# Patient Record
Sex: Male | Born: 1945
Health system: Southern US, Community
[De-identification: ages and names within clinical notes are randomized; demographics above are authoritative.]

## PROBLEM LIST (undated history)

## (undated) DIAGNOSIS — C61 Malignant neoplasm of prostate: Secondary | ICD-10-CM

## (undated) DIAGNOSIS — E7800 Pure hypercholesterolemia, unspecified: Secondary | ICD-10-CM

## (undated) DIAGNOSIS — J309 Allergic rhinitis, unspecified: Secondary | ICD-10-CM

## (undated) DIAGNOSIS — I10 Essential (primary) hypertension: Secondary | ICD-10-CM

## (undated) HISTORY — PX: HERNIA REPAIR: SHX51

## (undated) HISTORY — DX: Allergic rhinitis, unspecified: J30.9

---

## 1998-03-02 ENCOUNTER — Emergency Department (HOSPITAL_COMMUNITY): Admission: EM | Admit: 1998-03-02 | Discharge: 1998-03-02 | Payer: Self-pay | Admitting: Emergency Medicine

## 1998-03-03 ENCOUNTER — Emergency Department (HOSPITAL_COMMUNITY): Admission: EM | Admit: 1998-03-03 | Discharge: 1998-03-03 | Payer: Self-pay | Admitting: Emergency Medicine

## 1998-11-22 ENCOUNTER — Ambulatory Visit (HOSPITAL_COMMUNITY): Admission: RE | Admit: 1998-11-22 | Discharge: 1998-11-22 | Payer: Self-pay | Admitting: General Surgery

## 2001-08-05 ENCOUNTER — Ambulatory Visit (HOSPITAL_COMMUNITY): Admission: RE | Admit: 2001-08-05 | Discharge: 2001-08-05 | Payer: Self-pay | Admitting: Surgery

## 2003-06-15 ENCOUNTER — Ambulatory Visit (HOSPITAL_COMMUNITY): Admission: RE | Admit: 2003-06-15 | Discharge: 2003-06-15 | Payer: Self-pay | Admitting: Gastroenterology

## 2006-09-26 ENCOUNTER — Ambulatory Visit: Payer: Self-pay | Admitting: Internal Medicine

## 2007-09-29 ENCOUNTER — Ambulatory Visit: Payer: Self-pay | Admitting: Internal Medicine

## 2007-09-29 DIAGNOSIS — H1010 Acute atopic conjunctivitis, unspecified eye: Secondary | ICD-10-CM | POA: Insufficient documentation

## 2007-09-29 DIAGNOSIS — J309 Allergic rhinitis, unspecified: Secondary | ICD-10-CM

## 2007-09-29 DIAGNOSIS — J449 Chronic obstructive pulmonary disease, unspecified: Secondary | ICD-10-CM | POA: Insufficient documentation

## 2008-06-11 ENCOUNTER — Telehealth: Payer: Self-pay | Admitting: Internal Medicine

## 2008-08-26 ENCOUNTER — Telehealth (INDEPENDENT_AMBULATORY_CARE_PROVIDER_SITE_OTHER): Payer: Self-pay | Admitting: *Deleted

## 2008-09-28 ENCOUNTER — Ambulatory Visit: Payer: Self-pay | Admitting: Internal Medicine

## 2009-05-17 ENCOUNTER — Telehealth (INDEPENDENT_AMBULATORY_CARE_PROVIDER_SITE_OTHER): Payer: Self-pay | Admitting: *Deleted

## 2009-09-27 ENCOUNTER — Ambulatory Visit: Payer: Self-pay | Admitting: Internal Medicine

## 2010-08-05 ENCOUNTER — Telehealth: Payer: Self-pay | Admitting: Internal Medicine

## 2010-08-08 ENCOUNTER — Ambulatory Visit: Payer: Self-pay | Admitting: Internal Medicine

## 2010-10-10 NOTE — Assessment & Plan Note (Signed)
Summary: 12 months/apc   Visit Type:  annual visit Primary Provider/Referring Provider:  [name]  CC:  none  complaints.  History of Present Illness:  09/29/07- Had asthma in November, got Proair but not covered by [facility]- needs Xopenex. Triggered by Fall allergy with wheeze, eyes watering, sneezing, also has new dog. Had been on allergy vaccine in the past= skin test positive atopic. Discussed environmental precautions.  1/19.10- asthma, allergic rhinitis Had more allergy discomfort this past Fall, using otc eyedrops. Did need more regular use of rescue inhaler then as well. Remotely failed trials of allergy vaccine and of Singulair. Has a prescription steroid eye drop from his eye doc if needed.  September 27, 2009- Asthma, allergic rhinitis Unremarkable year. Uses Nasonex most days and it keeps his nose open. Denies epistaxis. Uses Qvar daily. In late summer and Fall he may wheeze a little and uses Proair only every few days at most. He walks daily.    Current Medications (verified): 1)  Nasonex 50 Mcg/act  Susp (Mometasone Furoate) .... 2 Spray Each Nostril Qd 2)  Qvar 40 Mcg/act  Aers (Beclomethasone Dipropionate) .... 2 Sprays Bid 3)  Proair Hfa 108 (90 Base) Mcg/act Aers (Albuterol Sulfate) .... 2 Puffs Four Times A Day As Needed 4)  Micardis Hct 80-12.5 Mg Tabs (Telmisartan-Hctz) .... Take 1 By Mouth Once Daily 5)  Lipitor 40 Mg Tabs (Atorvastatin Calcium) .... Take 1 By Mouth Once Daily  Allergies (verified): No Known Drug Allergies  Past History:  Past Medical History: Last updated: 09/28/2008 Childhood exzema and allergic rhinitis Allergic Rhinitis- failed allergy vaccine, Singulair Asthma- allergic- Fall  Past Surgical History: Last updated: 09/28/2008 Hernia repair left  Family History: Last updated: 10/21/2008 Son- allergic asthma Father-allergies, asthma, back/lung cancer Mother-lymphoma and breast cancer 2 siblings living and healthy  Social  History: Last updated: 10/21/2008 quit smoking 30-34yrs ago, smoked for approx 1year. states exercises 6x week states drinks 2 cups caffeine daily states drinks alcohol 6-8 drinks a week married 1 child  Risk Factors: Smoking Status: quit (09/29/2007)  Review of Systems      See HPI  The patient denies anorexia, fever, weight loss, weight gain, vision loss, decreased hearing, hoarseness, chest pain, syncope, dyspnea on exertion, peripheral edema, prolonged cough, headaches, hemoptysis, and severe indigestion/heartburn.    Vital Signs:  Patient profile:   65 year old male Height:      68 inches Weight:      160 pounds BMI:     24.42 O2 Sat:      98 % on Room air Pulse rate:   82 / minute BP sitting:   134 / 82  (right arm) Cuff size:   regular  Vitals Entered By: Clarise Cruz Duncan Dull) (September 27, 2009 9:06 AM)  O2 Flow:  Room air   Physical Exam  Additional Exam:  General: A/Ox3; pleasant and cooperative, NAD, SKIN: no rash, lesions NODES: no lymphadenopathy HEENT: [place]/AT, EOM- WNL, Conjuctivae- clear, PERRLA, TM-WNL, Nose- pale, crusted mucus, minimal red right side, glossy shiny on left, Throat- clear and wnl, Melampatti III NECK: Supple w/ fair ROM, JVD- none, normal carotid impulses w/o bruits Thyroid- CHEST: Clear to P&A HEART: RRR, no m/g/r heard ABDOMEN: Soft and nl;  EAV:WUJW, nl pulses, no edema  NEURO: Grossly intact to observation      Impression & Recommendations:  Problem # 1:  ASTHMA (ICD-493.90) Excellent control. We are going to let him try reducing Qvar to once daily to see if that will  hold him.  Problem # 2:  ALLERGIC RHINITIS (ICD-477.9)  Insignificant steroid erosion . Mucosa looks "allergic" and maintenance use of nasonex is reasonable. We did discusse erosion and cataracts as potential side effects. His updated medication list for this problem includes:    Nasonex 50 Mcg/act Susp (Mometasone furoate) .Marland Kitchen... 2 spray each nostril  qd  Medications Added to Medication List This Visit: 1)  Aspir-81 81 Mg Tbec (Aspirin) .Marland Kitchen.. 1 daily  Other Orders: Est. Patient Level III (84696)  Patient Instructions: 1)  Schedule return in one year, earlier if needed 2)  Try using Qvar, 2 puffs ONCE daily. if that doesn't hold you, go on back to twice daily. 3)  Scripts were sent to your drug store. Prescriptions: PROAIR HFA 108 (90 BASE) MCG/ACT AERS (ALBUTEROL SULFATE) 2 puffs four times a day as needed  #3 x 3   Entered and Authorized by:   Waymon Budge MD   Signed by:   Waymon Budge MD on 09/27/2009   Method used:   Electronically to        [facility]* (retail)       [address]       [address] Shipping/mailing       [place], [place]  [address]       Ph: [phone]       Fax: [phone]   RxID:   [id number] QVAR 40 MCG/ACT  AERS (BECLOMETHASONE DIPROPIONATE) 2 sprays bid  #3 x 3   Entered and Authorized by:   Waymon Budge MD   Signed by:   Waymon Budge MD on 09/27/2009   Method used:   Electronically to        [facility]* (retail)       [address]       [address] Shipping/mailing       [place], [place]  [address]       Ph: [phone]       Fax: [phone]   RxID:   [id number] NASONEX 50 MCG/ACT  SUSP (MOMETASONE FUROATE) 2 spray each nostril qd  #3 x 3   Entered and Authorized by:   Waymon Budge MD   Signed by:   Waymon Budge MD on 09/27/2009   Method used:   Electronically to        [facility]* (retail)       [address]       [address] Shipping/mailing       [place], [place]  [address]       Ph: [phone]       Fax: [phone]   RxID:   [id number]

## 2010-10-10 NOTE — Assessment & Plan Note (Signed)
Summary: per pt call/cb   Primary Lucas Armstrong/Referring Lucas Armstrong:  Lucas Armstrong  CC:  Follow up visit-had asthma attack last weekend(had prednisone rx called in )..  History of Present Illness: 09/29/07- Had asthma in November, got Proair but not covered by [facility]- needs Xopenex. Triggered by Fall allergy with wheeze, eyes watering, sneezing, also has new dog. Had been on allergy vaccine in the past= skin test positive atopic. Discussed environmental precautions.  1/19.10- asthma, allergic rhinitis Had more allergy discomfort this past Fall, using otc eyedrops. Did need more regular use of rescue inhaler then as well. Remotely failed trials of allergy vaccine and of Singulair. Has a prescription steroid eye drop from his eye doc if needed.  September 27, 2009- Asthma, allergic rhinitis Unremarkable year. Uses Nasonex most days and it keeps his nose open. Denies epistaxis. Uses Qvar daily. In late summer and Fall he may wheeze a little and uses Proair only every few days at most. He walks daily.   August 08, 2010- Asthma, allergic rhinitis Nurse-CC: Asthma, allergic rhinitis He called for prednisone taper 08/05/10 for asthma flare he blamed on yardwork/ leaves. No similar flare in a long time. Has a couple days left. He had been getting by on Qvar 40 used once daily. I suggested going back to twice daily at first sign of trouble. Starting in August he noted need for Ventolin  two times a day. Walking regularly. Denies chest pain, palpitation, swelling, blood, purulent, sinus.    Asthma History    Initial Asthma Severity Rating:    Age range: 12+ years    Symptoms: 0-2 days/week    Nighttime Awakenings: 0-2/month    Interferes w/ normal activity: no limitations    SABA use (not for EIB): 0-2 days/week    Asthma Severity Assessment: Intermittent   Preventive Screening-Counseling & Management  Alcohol-Tobacco     Smoking Status: quit     Packs/Day: <0.25     Year Started: high school     Year Quit: college  Comments: Never regular smoker and none since 1980's.   Current Medications (verified): 1)  Nasonex 50 Mcg/act  Susp (Mometasone Furoate) .... 2 Spray Each Nostril Qd 2)  Qvar 40 Mcg/act  Aers (Beclomethasone Dipropionate) .... 2 Sprays Bid 3)  Ventolin Hfa 108 (90 Base) Mcg/act Aers (Albuterol Sulfate) .... 2 Puffs Four Times A Day As Needed 4)  Micardis Hct 80-12.5 Mg Tabs (Telmisartan-Hctz) .... Take 1 By Mouth Once Daily 5)  Lipitor 40 Mg Tabs (Atorvastatin Calcium) .... Take 1 By Mouth Once Daily 6)  Aspir-81 81 Mg Tbec (Aspirin) .Marland Kitchen.. 1 Daily 7)  Prednisone 10 Mg Tabs (Prednisone) .Marland Kitchen.. 1 Tab Four Times Daily X 2 Days, 3 Times Daily X 2 Days, 2 Times Daily X 2 Days, 1 Time Daily X 2 Days 8)  Multivitamins  Tabs (Multiple Vitamin) .... Take 1 By Mouth Once Daily  Allergies (verified): No Known Drug Allergies  Past History:  Past Medical History: Last updated: 09/28/2008 Childhood exzema and allergic rhinitis Allergic Rhinitis- failed allergy vaccine, Singulair Asthma- allergic- Fall  Past Surgical History: Last updated: 09/28/2008 Hernia repair left  Family History: Last updated: 10/21/2008 Son- allergic asthma Father-allergies, asthma, back/lung cancer Mother-lymphoma and breast cancer 2 siblings living and healthy  Social History: Last updated: 10/21/2008 quit smoking 30-40yrs ago, smoked for approx 1year. states exercises 6x week states drinks 2 cups caffeine daily states drinks alcohol 6-8 drinks a week married 1 child  Risk Factors: Smoking Status: quit (08/08/2010) Packs/Day: <0.25 (  08/08/2010)  Social History: Packs/Day:  <0.25  Review of Systems      See HPI  The patient denies shortness of breath with activity, shortness of breath at rest, productive cough, non-productive cough, coughing up blood, chest pain, irregular heartbeats, acid heartburn, indigestion, loss of appetite, weight change, abdominal pain, difficulty  swallowing, sore throat, tooth/dental problems, headaches, nasal congestion/difficulty breathing through nose, and sneezing.    Vital Signs:  Patient profile:   65 year old male Height:      68 inches Weight:      158.50 pounds BMI:     24.19 O2 Sat:      95 % on Room air Pulse rate:   98 / minute BP sitting:   126 / 78  (left arm) Cuff size:   regular  Vitals Entered By: Lucas Armstrong CMA (August 08, 2010 1:34 PM)  O2 Flow:  Room air CC: Follow up visit-had asthma attack last weekend(had prednisone rx called in ).   Physical Exam  Additional Exam:  General: A/Ox3; pleasant and cooperative, NAD, SKIN: no rash, lesions NODES: no lymphadenopathy HEENT: [place]/AT, EOM- WNL, Conjuctivae- clear, PERRLA, TM-WNL, Nose- pale, crusted mucus, minimal red right side, glossy shiny on left, Throat- clear and wnl, Mallampati III NECK: Supple w/ fair ROM, JVD- none, normal carotid impulses w/o bruits Thyroid- CHEST: coarse breath sounds w/ mild wheeze  HEART: RRR, no m/g/r heard ABDOMEN: Soft and nl;  ZOX:WRUE, nl pulses, no edema  NEURO: Grossly intact to observation      Impression & Recommendations:  Problem # 1:  ASTHMA (ICD-493.90) Recent exacerbation seems allergy related. We emphasized need to rapidly increase Qvar back to two times a day at first sign of trouble. As he finishes prednisone taper we will continue Qvar.   Problem # 2:  ALLERGIC RHINITIS (ICD-477.9)  Sensitive to dust, cats. We have reviewed environmental precautions and avoidance of triggers. His updated medication list for this problem includes:    Nasonex 50 Mcg/act Susp (Mometasone furoate) .Marland Kitchen... 2 spray each nostril qd  Medications Added to Medication List This Visit: 1)  Ventolin Hfa 108 (90 Base) Mcg/act Aers (Albuterol sulfate) .... 2 puffs four times a day as needed 2)  Multivitamins Tabs (Multiple vitamin) .... Take 1 by mouth once daily  Other Orders: Est. Patient Level IV (45409) Prescription  Created Electronically [phone])  Patient Instructions: 1)  Please schedule a follow-up appointment in 6 months. 2)  Resume Qvar 40, 2 puffs and rinse mouth, twice daily 3)  Whe you feel well and stable again, you can try dropping back to once daily again. Always be quick to resuem two times a day dosing at first sign of trouble.  4)  Script for Ventolin to drug store Prescriptions: VENTOLIN HFA 108 (90 BASE) MCG/ACT AERS (ALBUTEROL SULFATE) 2 puffs four times a day as needed  #3 x 3   Entered and Authorized by:   Waymon Budge MD   Signed by:   Waymon Budge MD on 08/08/2010   Method used:   Electronically to        [facility]* (retail)       [address]       [address] Shipping/mailing       [place], [place]  [address]       Ph: [phone]       Fax: [phone]   RxID:   [id number]

## 2010-10-10 NOTE — Progress Notes (Signed)
Summary: On call- prednisone taper  Phone Note Call from Patient   Summary of Call: On call- He reports recent asthma flare, blaming yard work/ leaves. Using rescue inhaler regularly. We discussed and called in prednisne taper. Asked patient to schedule earlier appointment.  Initial call taken by: Waymon Budge MD,  August 05, 2010 9:45 PM    New/Updated Medications: PREDNISONE 10 MG TABS (PREDNISONE) 1 tab four times daily x 2 days, 3 times daily x 2 days, 2 times daily x 2 days, 1 time daily x 2 days Prescriptions: PREDNISONE 10 MG TABS (PREDNISONE) 1 tab four times daily x 2 days, 3 times daily x 2 days, 2 times daily x 2 days, 1 time daily x 2 days  #20 x 0   Entered and Authorized by:   Waymon Budge MD   Signed by:   Waymon Budge MD on 08/05/2010   Method used:   Telephoned to ...       [facility] [address] [phone]* (retail)       [address]       [place], [place]  [address]       Ph: [phone] or [phone]       Fax: [phone]   RxID:   [id number]

## 2010-12-11 ENCOUNTER — Other Ambulatory Visit: Payer: Self-pay | Admitting: Internal Medicine

## 2010-12-13 ENCOUNTER — Telehealth: Payer: Self-pay | Admitting: Internal Medicine

## 2010-12-13 MED ORDER — BECLOMETHASONE DIPROPIONATE 40 MCG/ACT IN AERS: INHALATION_SPRAY | RESPIRATORY_TRACT | Status: DC

## 2010-12-13 NOTE — Telephone Encounter (Signed)
LM on named machine letting pt know qvar was sent to pharmacy

## 2011-01-26 NOTE — Op Note (Signed)
   NAME:  Lucas Armstrong, Lucas Armstrong NO.:  [id number]   MEDICAL RECORD NO.:  [id number]                   PATIENT TYPE:  AMB   LOCATION:  ENDO                                 FACILITY:  [facility]   PHYSICIAN:  John C. Madilyn Fireman, M.D.                 DATE OF BIRTH:  [birth year]   DATE OF PROCEDURE:  06/15/2003  DATE OF DISCHARGE:                                 OPERATIVE REPORT   PROCEDURE:  Colonoscopy.   SURGEON:  John C. Madilyn Fireman, M.D.   INDICATIONS FOR PROCEDURE:  Colon cancer screening in a 65 year old patient  with no recent screening.   DESCRIPTION OF PROCEDURE:  The patient was placed in the left lateral  decubitus position and placed on the pulse monitor with continuous low flow  oxygen delivered by nasal cannula.  He was sedated with 100 mcg of IV  Fentanyl and 10 mg of IV Versed.  The Olympus video colonoscope was inserted  into the rectum and advanced to the cecum confirmed by transillumination of  McBurney's point and visualization of the ileocecal valve and the  appendiceal orifice.  The prep was excellent. The cecum, ascending,  transverse, descending and sigmoid colons all appeared normal with no  masses, polyps, diverticula or other mucosal abnormalities.  The rectum  likewise appeared normal in the retroflex view.  The anus revealed no  obvious internal hemorrhoids. The scope was then withdrawn and the patient  returned to the recovery room in stable condition.  He tolerated the  procedure were and there were no immediate complications.   IMPRESSION:  1. Normal colonoscopy.   PLAN:  Plan next screening by sigmoidoscopy in five years.                                               John C. Madilyn Fireman, M.D.    JCH/MEDQ  D:  06/15/2003  T:  06/15/2003  Job:  [id number]   cc:   Oley Balm. Georgina Pillion, M.D.  [address]  [place]  [place] [address]  Fax: [phone]

## 2011-01-26 NOTE — Op Note (Signed)
[facility]  Patient:    Lucas Armstrong, Lucas Armstrong Visit Number: [id number] MRN: [id number]          Service Type: DSU Location: DAY Attending Physician:  Shelly Rubenstein Dictated by:   Abigail Miyamoto, M.D. Proc. Date: 08/05/01 Admit Date:  08/05/2001                             Operative Report  PREOPERATIVE DIAGNOSIS:  Recurrent left inguinal hernia.  POSTOPERATIVE DIAGNOSIS:  Recurrent left inguinal hernia.  PROCEDURE:  Laparoscopic left inguinal hernia repair with mesh.  SURGEON:  Abigail Miyamoto, M.D.  ANESTHESIA:  General endotracheal anesthesia and 0.25% Marcaine plain.  ESTIMATED BLOOD LOSS:  Minimal.  INDICATIONS:  The patient is a 65 year old gentleman who had undergone a previous left inguinal hernia repair with mesh which was done as an open technique.  He has had recurrent pain in his groin.  No mass could be palpated on physical examination.  However, CAT scan had shown him to have a recurrence in the inguinal canal.  DESCRIPTION OF PROCEDURE:  The patient was brought to the operating room and identified as Lucas Armstrong.  He was placed supine on the operating table and general anesthesia was induced.  His abdomen was then prepped and draped in the usual sterile fashion.  Using a #15 blade, a small transverse incision was made at the umbilicus.  The incision was carried down to the fascia which was then opened with a scalpel.  The rectus muscle was identified and elevated.  The balloon dilator was then passed down the rectus sheath to the pubis.  The dissecting balloon was then insufflated, dissecting out the preperitoneal space.  The dissecting balloon was then removed and the balloon _______ port was then placed into the preperitoneal space.  Insufflation was then begun.  Insufflation caused a small tear in the peritoneum which exposed the sigmoid colon.  Secondary to this, a Veress needle was placed to help equilibrate out  the preperitoneal space.  Dissection was then carried out. The patient was found to have intense scarring from the previous hernia repair.  A small sac in the cord structures was finally identified and reduced.  Again, the anatomy was difficult to discern secondary to the intense scarring.  The pubis was finally identified along with Coopers ligament.  A piece of precut Prolene mesh was then brought on to the field and passed through the umbilical port.  The mesh was then placed in an onlay fashion over the cord structures and tacked with a few tacks to the Coopers ligament and then the anterior abdominal wall.  Good coverage of the cord and defect appeared to be achieved.  All ports were then removed under direct vision.  The preperitoneal space was deflated.  A 0 Vicryl was then used to close the fascial defect at the umbilicus.  All incisions were then infiltrated with 0.25% Marcaine and closed with 4-0 Monocryl subcuticular sutures.  Steri-Strips, gauze, and tape were then applied.  The patient tolerated the procedure well.  All sponge, needle, and instrument counts were correct at the end of the procedure.  The patient was then extubated in the operating room and taken in stable condition to the recovery room. Dictated by:   Abigail Miyamoto, M.D. Attending Physician:  Shelly Rubenstein DD:  08/05/01 TD:  08/05/01 Job: [id number]

## 2011-01-26 NOTE — Assessment & Plan Note (Signed)
[facility]                             PULMONARY OFFICE NOTE   NAME:Lucas Armstrong, Lucas Armstrong                   MRN:          [id number]  DATE:09/26/2006                            DOB:          04-23-1946    PROBLEM LIST:  1. Asthma.  2. Allergic rhinitis.   HISTORY:  This gentleman was last seen at [facility] in  2004 and comes now to establish here for continuity.  He says he is  doing well but needs refills on Flovent 110 and Nasonex.  He very rarely  has needed his rescue albuterol inhaler, not in several months.  He  mentions his son is on Advair and I compared Advair to albuterol.  He is  not having headache, chest pain, purulent or bloody discharge or  palpitation.   MEDICATIONS:  Nasonex, Flovent 110 two puffs b.i.d., rescue albuterol.   No medication allergy.   OBJECTIVE:  Weight 162 pounds, blood pressure 124/88, pulse regular 82,  room air saturation 97%.  His nose and chest seem clear.  There is no  periorbital edema.  No significant mucus in the nasal airway and no  postnasal drip or pharyngeal erythema.  Voice quality is normal.  Lungs  are clear.  Heart sounds regular without murmur.   IMPRESSION:  Asthma and rhinitis currently under good control.  We  discussed treatment options and avoidance of potential triggers.   PLAN:  1. We have refilled albuterol rescue inhaler for two puffs q.i.d.      p.r.n.  Refilled Flovent 110 for two puffs b.i.d.  Refilled Nasonex      for one spray each nostril daily during intervals when needed.  All      medications were reviewed.  2. Schedule return one year.  Earlier p.r.n.  3. He will follow up with Dr. Georgina Pillion for primary care.     Clinton D. Maple Hudson, MD, Tonny Bollman, FACP  Electronically Signed    CDY/MedQ  DD: 09/29/2006  DT: 09/29/2006  Job #: [id number]   cc:   Oley Balm. Georgina Pillion, M.D.

## 2011-01-29 ENCOUNTER — Encounter: Payer: Self-pay | Admitting: Internal Medicine

## 2011-02-06 ENCOUNTER — Encounter: Payer: Self-pay | Admitting: Internal Medicine

## 2011-02-06 ENCOUNTER — Ambulatory Visit (INDEPENDENT_AMBULATORY_CARE_PROVIDER_SITE_OTHER): Payer: Medicare Other | Admitting: Internal Medicine

## 2011-02-06 VITALS — BP 124/84 | HR 81 | Ht 68.0 in | Wt 164.2 lb

## 2011-02-06 DIAGNOSIS — J45909 Unspecified asthma, uncomplicated: Secondary | ICD-10-CM

## 2011-02-06 DIAGNOSIS — J309 Allergic rhinitis, unspecified: Secondary | ICD-10-CM

## 2011-02-06 MED ORDER — BECLOMETHASONE DIPROPIONATE 40 MCG/ACT IN AERS: INHALATION_SPRAY | RESPIRATORY_TRACT | Status: DC

## 2011-02-06 MED ORDER — ALBUTEROL SULFATE HFA 108 (90 BASE) MCG/ACT IN AERS: 2.0000 | INHALATION_SPRAY | Freq: Four times a day (QID) | RESPIRATORY_TRACT | Status: DC | PRN

## 2011-02-06 MED ORDER — MOMETASONE FUROATE 50 MCG/ACT NA SUSP: 2.0000 | Freq: Every day | NASAL | Status: DC

## 2011-02-06 NOTE — Assessment & Plan Note (Signed)
Nasal mucosa looks very atopic. He is satisfied to wait it out, assuming the season will role over.

## 2011-02-06 NOTE — Assessment & Plan Note (Signed)
Seasonal exacerbation, but not wheezing now and looks comfortable. We discussed and will give a sample of Qvar 80 to use up.

## 2011-02-06 NOTE — Patient Instructions (Signed)
Use up sample Qvar 80- 2puffs and rinse, twice daily  Then return to Qvar 40  Med refills sent to [facility] pharmacy

## 2011-02-06 NOTE — Progress Notes (Signed)
  Subjective:    Patient ID: Lucas Armstrong, male    DOB: [birth year], 65 y.o.   MRN: [id number]  HPI 02/06/11- 65 yyoM former smoker followed for asthma, allergic rhinitis. Last here August 08, 2010. Note reviewed. He commented this was a long allergy season. Throat congestion. He has noted more asthma, with unusually frequent need for rescue inhaler, used at least once nightly. He has used Qvar 40 twice daily since Thanksgiving. Asthma is usually a Spring and Fall problem, plus dust at Christmas.  Nose is ok with Nasonex.   Review of Systems Constitutional:   No weight loss, night sweats,  Fevers, chills, fatigue, lassitude. HEENT:   No headaches,  Difficulty swallowing,  Tooth/dental problems,  Sore throat,                  CV:  No chest pain,  Orthopnea, PND, swelling in lower extremities, anasarca, dizziness, palpitations  GI  No heartburn, indigestion, abdominal pain, nausea, vomiting, diarrhea, change in bowel habits, loss of appetite  Resp: No shortness of breath with exertion or at rest.  No excess mucus, No coughing up of blood.  No change in color of mucus.    +wheezing.   Skin: no rash or lesions.  GU: no dysuria, change in color of urine, no urgency or frequency.  No flank pain.  MS:  No joint pain or swelling.  No decreased range of motion.  No back pain.  Psych:  No change in mood or affect. No depression or anxiety.  No memory loss.      Objective:   Physical Exam General- Alert, Oriented, Affect-appropriate, Distress- none acute  Skin- rash-none, lesions- none, excoriation- none  Lymphadenopathy- none  Head- atraumatic  Eyes- Gross vision intact, PERRLA, conjunctivae clear, secretions  Ears- Hearing, canals, Tm- normal  Nose- Turbinates edematous and pale,                 No-Septal dev, mucus, polyps, erosion, perforation   Throat- Mallampati II , mucosa clear , drainage- none, tonsils- atrophic  Neck- flexible , trachea midline, no stridor ,  thyroid nl, carotid no bruit  Chest - symmetrical excursion , unlabored     Heart/CV- RRR , no murmur , no gallop  , no rub, nl s1 s2                     - JVD- none , edema- none, stasis changes- none, varices- none     Lung- clear to P&A, wheeze- none, cough- none , dullness-none, rub- none     Chest wall-   Abd- tender-no, distended-no, bowel sounds-present, HSM- no  Br/ Gen/ Rectal- Not done, not indicated  Extrem- cyanosis- none, clubbing, none, atrophy- none, strength- nl  Neuro- grossly intact to observation         Assessment & Plan:

## 2011-08-09 ENCOUNTER — Encounter: Payer: Self-pay | Admitting: Internal Medicine

## 2011-08-09 ENCOUNTER — Ambulatory Visit (INDEPENDENT_AMBULATORY_CARE_PROVIDER_SITE_OTHER): Payer: Medicare Other | Admitting: Internal Medicine

## 2011-08-09 VITALS — BP 142/82 | HR 75 | Ht 68.0 in | Wt 159.4 lb

## 2011-08-09 DIAGNOSIS — J45909 Unspecified asthma, uncomplicated: Secondary | ICD-10-CM

## 2011-08-09 NOTE — Progress Notes (Signed)
Patient ID: Lucas Armstrong, male    DOB: 1946/03/08, 65 y.o.   MRN: [id number]  HPI 02/06/11- 65 yyoM former smoker followed for asthma, allergic rhinitis. Last here August 08, 2010. Note reviewed. He commented this was a long allergy season. Throat congestion. He has noted more asthma, with unusually frequent need for rescue inhaler, used at least once nightly. He has used Qvar 40 twice daily since Thanksgiving. Asthma is usually a Spring and Fall problem, plus dust at Christmas.  Nose is ok with Nasonex.   08/09/11- 65 yoM former smoker followed for asthma, allergic rhinitis. Has had flu vaccine. Wheeze some in the fall until [place] came, but now feels well. Meds were sufficient even through that time. He is using his rescue inhaler about one time a week. He continues Qvar 42 puffs twice daily. These medicines he considers sufficient.  Review of Systems Constitutional:   No-   weight loss, night sweats, fevers, chills, fatigue, lassitude. HEENT:   No-  headaches, difficulty swallowing, tooth/dental problems, sore throat,       No- recent sneezing, itching, ear ache, nasal congestion, post nasal drip,  CV:  No-   chest pain, orthopnea, PND, swelling in lower extremities, anasarca,                                  dizziness, palpitations Resp: No-   shortness of breath with exertion or at rest.              No-   productive cough,  No non-productive cough,  No- coughing up of blood.              No-   change in color of mucus.  No-recent wheezing.   Skin: No-   rash or lesions. GI:  No-   heartburn, indigestion, abdominal pain, nausea, vomiting, diarrhea,                 change in bowel habits, loss of appetite GU: No-   dysuria, change in color of urine, no urgency or frequency.  No- flank pain. MS:  No-   joint pain or swelling.  No- decreased range of motion.  No- back pain. Neuro-     nothing unusual Psych:  No- change in mood or affect. No depression or anxiety.  No memory  loss.      Objective:   Physical Exam General- Alert, Oriented, Affect-appropriate, Distress- none acute Skin- rash-none, lesions- none, excoriation- none Lymphadenopathy- none Head- atraumatic            Eyes- Gross vision intact, PERRLA, conjunctivae- minor vascular injection lateral right eye.            Ears- Hearing, canals-normal            Nose- Clear, no-Septal dev, mucus, polyps, erosion, perforation             Throat- Mallampati II , mucosa clear , drainage- none, tonsils- atrophic Neck- flexible , trachea midline, no stridor , thyroid nl, carotid no bruit Chest - symmetrical excursion , unlabored           Heart/CV- RRR , no murmur , no gallop  , no rub, nl s1 s2                           - JVD- none , edema- none, stasis changes- none,  varices- none           Lung- clear to P&A, wheeze- none, cough- none , dullness-none, rub- none           Chest wall-  Abd- tender-no, distended-no, bowel sounds-present, HSM- no Br/ Gen/ Rectal- Not done, not indicated Extrem- cyanosis- none, clubbing, none, atrophy- none, strength- nl Neuro- grossly intact to observation

## 2011-08-09 NOTE — Patient Instructions (Signed)
Please call as needed 

## 2011-08-12 NOTE — Assessment & Plan Note (Signed)
Mild intermittent asthma with a seasonal component. He really feels pretty satisfied with his current management so we will make changes. I suggested he paid attention with season change such as early spring.

## 2011-11-22 DIAGNOSIS — I10 Essential (primary) hypertension: Secondary | ICD-10-CM | POA: Insufficient documentation

## 2012-02-11 ENCOUNTER — Other Ambulatory Visit: Payer: Self-pay | Admitting: Internal Medicine

## 2012-05-01 ENCOUNTER — Other Ambulatory Visit: Payer: Self-pay | Admitting: Internal Medicine

## 2012-06-05 ENCOUNTER — Telehealth: Payer: Self-pay | Admitting: Internal Medicine

## 2012-06-05 MED ORDER — PREDNISONE 10 MG PO TABS: ORAL_TABLET | ORAL | Status: DC

## 2012-06-05 NOTE — Telephone Encounter (Signed)
Spoke with pt. He states that his asthma has been flaring up for the past 2-3 days- having increased SOB and wheezing- esp at night when lies down. Using rescue inhaler several times per day with little relief. Would like something called in. No appt's with CDY open for today. Last ov 08/09/11 and has one pending for 08/08/12. Please advise, thanks! No Known Allergies

## 2012-06-05 NOTE — Telephone Encounter (Signed)
Per CY-okay to give Prednisone 10 mg #20 take 4 x 2 days, 3 x 2 days, 2 x 2 days, 1 x 2 days then stop no refills.  

## 2012-06-05 NOTE — Telephone Encounter (Signed)
Spoke with pt and notified of recs per CDY Pt verbalized understanding and states nothing further needed Rx was sent to pharm 

## 2012-06-09 ENCOUNTER — Other Ambulatory Visit: Payer: Self-pay | Admitting: Internal Medicine

## 2012-07-01 ENCOUNTER — Telehealth: Payer: Self-pay | Admitting: Critical Care Medicine

## 2012-07-01 MED ORDER — PREDNISONE 10 MG PO TABS: ORAL_TABLET | ORAL | Status: DC

## 2012-07-01 NOTE — Telephone Encounter (Signed)
lmomtcb x1 

## 2012-07-01 NOTE — Telephone Encounter (Signed)
CORRECTION: this is a CY patient, NOT PW.  Last seen by CY 11.29.13, follow up in 1 yr > 11.29.13.  Called spoke with patient who c/o asthma flare w/ wheezing and increased SOB x3 days, worse since last night.  Denies tightness, cough, f/c/s.  Dr Maple Hudson please advise, thanks.  No Known Allergies [facility]

## 2012-07-01 NOTE — Telephone Encounter (Signed)
Pt aware of CDY recs. He was fine with prednisone being called in. RX has been sent. Nothing further was needed

## 2012-07-01 NOTE — Telephone Encounter (Signed)
Recommend prednisone taper Pred 10 mg, # 20, 4 X 2 DAYS, 3 X 2 DAYS, 2 X 2 DAYS, 1 X 2 DAYS

## 2012-07-01 NOTE — Telephone Encounter (Signed)
Pt return call. Please cb

## 2012-07-17 ENCOUNTER — Telehealth: Payer: Self-pay | Admitting: Internal Medicine

## 2012-07-17 MED ORDER — PREDNISONE 10 MG PO TABS: ORAL_TABLET | ORAL | Status: DC

## 2012-07-17 NOTE — Telephone Encounter (Signed)
Called and spoke with patient, he states that last night he had a very difficult time sleeping. Feels like his asthma is starting up again.  Woke up "huffing and puffing" and has been using his albuterol inhaler "every hour on the hour."  Patient states he is afraid to go to sleep one more night with out this taken care of.  He states he just finished a round of prednisone 1-2 weeks ago and feels maybe he may need some more.  Patient says he will come in to be seen if need be or if Dr. Maple Hudson could send in a Rx that would be good.  Dr. Maple Hudson please advise on how you would like to proceed.    Patient says when calling back a detailed messaged can be left on his VM.  No Known Allergies  Last OV:08/09/11 --- follow up in one year Next OV:08/08/12  [facility].

## 2012-07-17 NOTE — Telephone Encounter (Signed)
Returning call.

## 2012-07-17 NOTE — Telephone Encounter (Signed)
LMOMTCB x 1.  RX sent.

## 2012-07-17 NOTE — Telephone Encounter (Signed)
Per CY-okay to give Prednisone 10mg #20 take 4 x 2 days, 3 x 2 days, 2 x 2 days, 1 x 2 days, then stop no refills.  

## 2012-07-17 NOTE — Telephone Encounter (Signed)
Pt advised. Jennifer Castillo, CMA  

## 2012-08-08 ENCOUNTER — Ambulatory Visit (INDEPENDENT_AMBULATORY_CARE_PROVIDER_SITE_OTHER): Payer: 59 | Admitting: Internal Medicine

## 2012-08-08 ENCOUNTER — Encounter: Payer: Self-pay | Admitting: Internal Medicine

## 2012-08-08 VITALS — BP 140/88 | HR 83 | Ht 68.0 in | Wt 159.0 lb

## 2012-08-08 DIAGNOSIS — J45998 Other asthma: Secondary | ICD-10-CM

## 2012-08-08 DIAGNOSIS — J45909 Unspecified asthma, uncomplicated: Secondary | ICD-10-CM

## 2012-08-08 DIAGNOSIS — J309 Allergic rhinitis, unspecified: Secondary | ICD-10-CM

## 2012-08-08 MED ORDER — ALBUTEROL SULFATE HFA 108 (90 BASE) MCG/ACT IN AERS: 2.0000 | INHALATION_SPRAY | RESPIRATORY_TRACT | Status: DC | PRN

## 2012-08-08 MED ORDER — BECLOMETHASONE DIPROPIONATE 80 MCG/ACT IN AERS: 1.0000 | INHALATION_SPRAY | Freq: Two times a day (BID) | RESPIRATORY_TRACT | Status: DC

## 2012-08-08 MED ORDER — MOMETASONE FUROATE 50 MCG/ACT NA SUSP: NASAL | Status: DC

## 2012-08-08 MED ORDER — BECLOMETHASONE DIPROPIONATE 40 MCG/ACT IN AERS: INHALATION_SPRAY | RESPIRATORY_TRACT | Status: DC

## 2012-08-08 NOTE — Patient Instructions (Addendum)
Scripts sent refilling Ventolin, Nasonex, Qvar 40  Sample to use now for Qvar 80      2 puffs then rinse mouth well twice daily. When finished, if you have been stable, we can try dropping back to Qvar 40.

## 2012-08-08 NOTE — Progress Notes (Signed)
Patient ID: Lucas Armstrong, male    DOB: 1946/07/05, 66 y.o.   MRN: [id number]  HPI 02/06/11- 46 yyoM former smoker followed for asthma, allergic rhinitis. Last here August 08, 2010. Note reviewed. He commented this was a long allergy season. Throat congestion. He has noted more asthma, with unusually frequent need for rescue inhaler, used at least once nightly. He has used Qvar 40 twice daily since Thanksgiving. Asthma is usually a Spring and Fall problem, plus dust at Christmas.  Nose is ok with Nasonex.   08/09/11- 72 yoM former smoker followed for asthma, allergic rhinitis. Has had flu vaccine. Wheeze some in the fall until [place] came, but now feels well. Meds were sufficient even through that time. He is using his rescue inhaler about one time a week. He continues Qvar 42 puffs twice daily. These medicines he considers sufficient.  08/08/12- 66 yoM former smoker followed for asthma, allergic rhinitis. Pt reports breathing has been "pretty normal"--"some" asthma attacks but other than that denies any other concerns Has had flu vaccine. Several  asthma episodes over the past year when he needed prednisone, particularly after getting up leaves this fall. He has been using Qvar 2 puffs twice daily. Has had some nasal congestion and drainage + Will need chest x-ray and PFT for data base at future visit+   Review of Systems- HPI Constitutional:   No-   weight loss, night sweats, fevers, chills, fatigue, lassitude. HEENT:   No-  headaches, difficulty swallowing, tooth/dental problems, sore throat,       No- recent sneezing, itching, ear ache, +nasal congestion, post nasal drip,  CV:  No-   chest pain, orthopnea, PND, swelling in lower extremities, anasarca,   dizziness, palpitations Resp: No-   shortness of breath with exertion or at rest.              No-   productive cough,  No non-productive cough,  No- coughing up of blood.              No-   change in color of mucus.  +recent  wheezing.   Skin: No-   rash or lesions. GI:   GU:  MS:  No-   joint pain or swelling Neuro-     nothing unusual Psych:  No- change in mood or affect. No depression or anxiety.  No memory loss   Objective:   Physical Exam General- Alert, Oriented, Affect-appropriate, Distress- none acute Skin- rash-none, lesions- none, excoriation- none Lymphadenopathy- none Head- atraumatic            Eyes- Gross vision intact, PERRLA, conjunctivae-             Ears- Hearing-decreased            Nose- Clear, no-Septal dev, mucus, polyps, erosion, perforation             Throat- Mallampati II , mucosa clear , drainage- none, tonsils- atrophic Neck- flexible , trachea midline, no stridor , thyroid nl, carotid no bruit Chest - symmetrical excursion , unlabored           Heart/CV- RRR , no murmur , no gallop  , no rub, nl s1 s2                           - JVD- none , edema- none, stasis changes- none, varices- none           Lung- clear to P&A,  wheeze- none, cough- none , dullness-none, rub- none           Chest wall-  Abd-  Br/ Gen/ Rectal- Not done, not indicated Extrem- cyanosis- none, clubbing, none, atrophy- none, strength- nl Neuro- grossly intact to observation

## 2012-08-20 NOTE — Assessment & Plan Note (Signed)
I'm unsure how important it is "asthma flares" were but we want to minimize need for systemic steroids as discussed. Plan-change to Qvar 80, 2 puffs twice daily. If he remains very stable he can drop back to Qvar 40 as discussed. Questions answered. Refill rescue inhaler

## 2012-08-20 NOTE — Assessment & Plan Note (Signed)
Fair control. We will refill Nasonex and discuss future options.

## 2013-03-18 ENCOUNTER — Telehealth: Payer: Self-pay | Admitting: Internal Medicine

## 2013-03-18 MED ORDER — ALBUTEROL SULFATE HFA 108 (90 BASE) MCG/ACT IN AERS: 2.0000 | INHALATION_SPRAY | RESPIRATORY_TRACT | Status: DC | PRN

## 2013-03-18 NOTE — Telephone Encounter (Signed)
Rx has been sent in. Pt is aware. 

## 2013-05-21 DIAGNOSIS — C61 Malignant neoplasm of prostate: Secondary | ICD-10-CM

## 2013-05-21 HISTORY — DX: Malignant neoplasm of prostate: C61

## 2013-06-08 ENCOUNTER — Telehealth: Payer: Self-pay | Admitting: Internal Medicine

## 2013-06-08 MED ORDER — PREDNISONE 10 MG PO TABS: ORAL_TABLET | ORAL | Status: DC

## 2013-06-08 NOTE — Telephone Encounter (Signed)
Per CY-okay to give Prednisone 10mg #20 take 4 x 2 days, 3 x 2 days, 2 x 2 days, 1 x 2 days, then stop no refills.  

## 2013-06-08 NOTE — Telephone Encounter (Signed)
Pt returned phone call.  Advised Rx for prednisone was sent to pharmacy.  Pt states nothing further is needed at this time.  Antionette Fairy

## 2013-06-08 NOTE — Telephone Encounter (Signed)
RX has been sent to the pharmacy. ATC PT NA WCB

## 2013-06-08 NOTE — Telephone Encounter (Signed)
LMOMTCB x1 for pt 

## 2013-06-08 NOTE — Telephone Encounter (Signed)
I spoke with Lucas Armstrong. He c/o wheezing x last week. Denies any chest tx, no cough, no increase SOB. He is requesting to have prednisone called in for him. He stated this usually happens this time of the year. Please advise Dr. Maple Hudson thanks Last OV 08/08/12 Pending 08/11/13 No Known Allergies

## 2013-06-22 ENCOUNTER — Telehealth: Payer: Self-pay | Admitting: Internal Medicine

## 2013-06-22 MED ORDER — PREDNISONE 10 MG PO TABS: ORAL_TABLET | ORAL | Status: DC

## 2013-06-22 NOTE — Telephone Encounter (Signed)
I spoke with pt and made aware of recs. rx has been sent

## 2013-06-22 NOTE — Telephone Encounter (Signed)
Pt c/o wheezing for past 3-4 days - Denies sob, cough, chest tightness, or fever.  Pt using Qvar 40 2 puffs bid and Albuterol prn.  Please advise.

## 2013-06-22 NOTE — Telephone Encounter (Signed)
Per CY-give Prednisone 10 mg #20 take 4 x 2 days, 3 x 2 days, 2 x 2 days, 1 x 2 days, then stop no refills.  

## 2013-06-22 NOTE — Telephone Encounter (Signed)
ATC - Line busy - Will try back

## 2013-07-07 ENCOUNTER — Encounter: Payer: Self-pay | Admitting: Radiation Oncology

## 2013-07-07 DIAGNOSIS — C61 Malignant neoplasm of prostate: Secondary | ICD-10-CM | POA: Insufficient documentation

## 2013-07-07 NOTE — Progress Notes (Signed)
GU Location of Tumor / Histology: prostate  If Prostate Cancer, Gleason Score is (3 + 4) and PSA is (6.56 on 11/05/12, 3.98 on 03/20/13)  Patient presented 6 months ago with signs/symptoms of: elevated PSA  Biopsies of prostate (if applicable) revealed: adenocarcinoma, 6/12 cores, Gleason 3+4=7, volume 27 cc  Past/Anticipated interventions by urology, if any: none  Past/Anticipated interventions by medical oncology, if any: none  Weight changes, if any: no  Bowel/Bladder complaints, if any:  IPSS 4, urgency, nocturia x 1  Nausea/Vomiting, if any: no  Pain issues, if any:  no  SAFETY ISSUES:  Prior radiation? no  Pacemaker/ICD? no  Possible current pregnancy? na  Is the patient on methotrexate? no  Current Complaints / other details:  Married, Art gallery manager, 1 son, 06/17/13 CT pelvis: no evidence of nodal mets, single left common iliac lymph node 7 mm

## 2013-07-08 ENCOUNTER — Ambulatory Visit
Admission: RE | Admit: 2013-07-08 | Discharge: 2013-07-08 | Disposition: A | Discharge status: Home / Self Care | Payer: Medicare Other | Source: Ambulatory Visit | Attending: Radiation Oncology | Admitting: Radiation Oncology

## 2013-07-08 ENCOUNTER — Encounter: Payer: Self-pay | Admitting: Radiation Oncology

## 2013-07-08 VITALS — BP 127/82 | HR 106 | Temp 98.7°F | Resp 20 | Ht 68.0 in | Wt 155.4 lb

## 2013-07-08 DIAGNOSIS — C61 Malignant neoplasm of prostate: Secondary | ICD-10-CM | POA: Diagnosis not present

## 2013-07-08 HISTORY — DX: Malignant neoplasm of prostate: C61

## 2013-07-08 HISTORY — DX: Essential (primary) hypertension: I10

## 2013-07-08 HISTORY — DX: Pure hypercholesterolemia, unspecified: E78.00

## 2013-07-08 NOTE — Progress Notes (Signed)
[facility] Radiation Oncology NEW PATIENT EVALUATION  Name: Lucas Armstrong MRN: [id number]  Date:   07/08/2013           DOB: [birth year]  Status: outpatient   CC: Dr. [name],  Milford Cage,*    REFERRING PHYSICIAN: Milford Cage,*   DIAGNOSIS: Stage TI C. intermediate risk adenocarcinoma prostate   HISTORY OF PRESENT ILLNESS:  Lucas Armstrong is a 67 y.o. male who is seen today for the courtesy of Dr. Margarita Grizzle for discussion of possible radiation therapy in the management of his stage TI C. intermediate risk adenocarcinoma prostate. His PSA was 6.56 this past February, then falling to 3.55 in April, and then rising to 3.98 by 03/20/2013. He underwent ultrasound-guided biopsies by Dr. Margarita Grizzle on 05/21/2013. He was found to have Gleason 7 (3+4) involving 60% of one core from the left apex and Gleason 6 (3+3) involving 5% of one core from the left lateral base, 5% of one core from the left base, 30% of one core from left lateral mid gland and 50% of one core from the left mid gland. His gland volume was approximately 27 cc. He is doing well from a GU and GI standpoint. His I PSS score is 4. He is potent but not very active sexually.  PREVIOUS RADIATION THERAPY: No   PAST MEDICAL HISTORY:  has a past medical history of Asthma; Allergic rhinitis, cause unspecified; Prostate cancer (05/21/13); Hypertension; and Hypercholesterolemia.     PAST SURGICAL HISTORY:  Past Surgical History  Procedure Laterality Date  . Hernia repair Left      FAMILY HISTORY: family history includes Asthma in an other family member; Breast cancer in his mother; Cancer in his mother; Lung cancer in his father. His father died from lung cancer at 65, in his mother died from breast cancer 83. No family history of prostate cancer.   SOCIAL HISTORY:  reports that he quit smoking about 34 years ago. His smoking use included Cigarettes. He smoked 0.00 packs per day for 1  year. He has never used smokeless tobacco. He reports that he drinks alcohol. He reports that he does not use illicit drugs. Married, one son age 89. He works as a Sport and exercise psychologist.   ALLERGIES: Review of patient's allergies indicates no known allergies.   MEDICATIONS:  Current Outpatient Prescriptions  Medication Sig Dispense Refill  . albuterol (VENTOLIN HFA) 108 (90 BASE) MCG/ACT inhaler Inhale 2 puffs into the lungs every 4 (four) hours as needed for wheezing or shortness of breath.  51 each  1  . atorvastatin (LIPITOR) 40 MG tablet Take 40 mg by mouth daily.        . beclomethasone (QVAR) 40 MCG/ACT inhaler 2 puffs and rinse mouth well, twice daily  3 Inhaler  3  . mometasone (NASONEX) 50 MCG/ACT nasal spray 1-2 puffs each nostril once or twice daily  51 g  3  . Multiple Vitamin (MULTIVITAMIN) capsule Take 1 capsule by mouth daily.        Marland Kitchen telmisartan-hydrochlorothiazide (MICARDIS HCT) 80-12.5 MG per tablet Take 1 tablet by mouth daily.         No current facility-administered medications for this encounter.     REVIEW OF SYSTEMS:  Pertinent items are noted in HPI.    PHYSICAL EXAM:  height is 5\' 8"  (1.727 m) and weight is 155 lb 6.4 oz (70.489 kg). His oral temperature is 98.7 F (37.1 C). His blood pressure is 127/82 and his  pulse is 106. His respiration is 20.   Alert and oriented 67 year old white male appearing his stated age. Head and neck examination: Grossly unremarkable. Nodes: Without palpable cervical or supraclavicular adenopathy. Chest: Lungs clear. Back: Without spinal or CVA tenderness. Heart: Regular rate and rhythm. Abdomen: Without masses organomegaly. Genitalia: Unremarkable to inspection. Rectal: The prostate gland is normal in size and is without focal induration or nodularity. Extremities: Without edema.   LABORATORY DATA:  No results found for this basename: WBC, HGB, HCT, MCV, PLT   No results found for this basename: NA, K, CL, CO2   No results found for  this basename: ALT, AST, GGT, ALKPHOS, BILITOT   PSA 3.98 from 03/20/2013.   IMPRESSION: Stage TI C. intermediate risk adenocarcinoma prostate. I explained to the patient that his prognosis is related to his stage, PSA level, and Gleason score. His stage and PSA level are favorable while his Gleason score of 7 is of intermediate favorability. Other prognostic factors include disease volume and PSA doubling time which also appear to be favorable. His treatment options include surgery versus close surveillance versus radiation therapy. Radiation therapy options include seed implantation with or without 5 weeks of external beam therapy or 8 weeks of external beam/IMRT. I would consider seed implantation alone provided that we obtain a MRI scan to rule out extracapsular extension in view of his Gleason 7 disease. We discussed the potential acute and late toxicities of radiation therapy in great detail. After lengthy conversation he tells me that he is not interested in surgery, but would like to proceed with external beam/IMRT. We discussed the placement of 3 gold seed markers by Dr. Margarita Grizzle, and then having him return for CT simulation/treatment planning. We also talked about being treated with a comfortably full bladder to minimize urinary related toxicity. Consent is signed today. He wants me to go ahead and get him scheduled for placement of 3 gold seeds and then simulation/treatment planning. He'll see Dr. Margarita Grizzle next week.   PLAN: As discussed above.   I spent 60 minutes minutes face to face with the patient and more than 50% of that time was spent in counseling and/or coordination of care.

## 2013-07-08 NOTE — Progress Notes (Signed)
Please see the Nurse Progress Note in the MD Initial Consult Encounter for this patient. 

## 2013-07-09 ENCOUNTER — Telehealth: Payer: Self-pay | Admitting: *Deleted

## 2013-07-09 NOTE — Telephone Encounter (Signed)
CALLED PATIENT TO INFORM OF GOLD SEED PLACEMENT ON 07-21-13 - ARRIVAL TIME - 11:15 AM AT DR. Hilario Quarry OFFICE AND HIS SIM AT DR. MURRAY'S OFFICE ON 07-23-13 - AT 9:00 AM, LVM FOR A RETURN CALL

## 2013-07-23 ENCOUNTER — Ambulatory Visit
Admission: RE | Admit: 2013-07-23 | Discharge: 2013-07-23 | Disposition: A | Discharge status: Home / Self Care | Payer: Medicare Other | Source: Ambulatory Visit | Attending: Radiation Oncology | Admitting: Radiation Oncology

## 2013-07-23 DIAGNOSIS — R351 Nocturia: Secondary | ICD-10-CM | POA: Insufficient documentation

## 2013-07-23 DIAGNOSIS — R3915 Urgency of urination: Secondary | ICD-10-CM | POA: Insufficient documentation

## 2013-07-23 DIAGNOSIS — R350 Frequency of micturition: Secondary | ICD-10-CM | POA: Insufficient documentation

## 2013-07-23 DIAGNOSIS — C61 Malignant neoplasm of prostate: Secondary | ICD-10-CM

## 2013-07-23 DIAGNOSIS — R5381 Other malaise: Secondary | ICD-10-CM | POA: Insufficient documentation

## 2013-07-23 DIAGNOSIS — Z510 Encounter for antineoplastic radiation therapy: Secondary | ICD-10-CM | POA: Insufficient documentation

## 2013-07-23 NOTE — Progress Notes (Signed)
Complex simulation/treatment planning note: The patient was taken to the CT simulator. He was placed supine. A Vac lock immobilization device was constructed. A red rubber cath was placed within the rectal vault. Because of phimosis he was not catheterized. His pelvis was scanned. The CT data set was sent to the planning system for contouring of his normal structures including his target structures, prostate, and seminal vesicles. I prescribing 7800 cGy to his prostate PTV which represents the prostate was 0.8 cm except for 0.5 cm along the rectum. I prescribing 5600 cGy in 40 sessions to his seminal vesicle PTV represents a seminal vesicles posterior 0.5 cm. He'll be treated with a comfortably full bladder. I requesting daily beam CT setting up to his 3 gold seeds.

## 2013-07-24 ENCOUNTER — Encounter: Payer: Self-pay | Admitting: Radiation Oncology

## 2013-07-24 NOTE — Progress Notes (Signed)
IMRT simulation/treatment planning note: IMRT was performed in the management of his carcinoma the prostate. IMRT was chosen to decrease the risk for both acute and late bladder and rectal toxicity compared to conventional or 3-D conformal radiation therapy. Dose volume histograms were obtained for the target structures including the prostate and seminal vesicles and also avoidance structures including the bladder, rectum, and femoral heads. We met our departmental goals despite having a bladder less than 100 cc. I prescribing 7800 cGy to his prostate PTV in 40 sessions and 5600 cGy to his seminal vesicle PTV in 40 sessions. I requesting that he be treated with a comfortably full bladder and undergo daily cone beam CT setting up to his 3 gold seeds.

## 2013-08-03 ENCOUNTER — Ambulatory Visit
Admission: RE | Admit: 2013-08-03 | Discharge: 2013-08-03 | Disposition: A | Discharge status: Home / Self Care | Payer: Medicare Other | Source: Ambulatory Visit | Attending: Radiation Oncology | Admitting: Radiation Oncology

## 2013-08-03 ENCOUNTER — Encounter: Payer: Self-pay | Admitting: Radiation Oncology

## 2013-08-03 VITALS — BP 114/65 | HR 93 | Temp 98.6°F | Resp 20 | Wt 152.5 lb

## 2013-08-03 DIAGNOSIS — C61 Malignant neoplasm of prostate: Secondary | ICD-10-CM

## 2013-08-03 NOTE — Progress Notes (Signed)
Chart note Mr. Cavanah begins radiation therapy today in the management of his carcinoma the prostate. He is being treated with 2 volume modulated arcs utilizing dynamic MLCs representing one set of IMRT treatment devices [phone])

## 2013-08-03 NOTE — Progress Notes (Signed)
Post sim ed completed w/pt. Gave pt "Radiation and You" booklet w/all pertinent information marked and discussed, re : fatigue, rectal discomfort/care, bladder irritation/care, nutrition, pain. Pt verbalized understanding.\ Pt denies pain, fatigue, loss of appetite. Pt understands need to have comfortably full bladder.

## 2013-08-03 NOTE — Progress Notes (Signed)
Weekly Management Note:  Site: Prostate Current Dose:  195  cGy Projected Dose: 7800  cGy  Narrative: The patient is seen today for routine under treatment assessment. CBCT/MVCT images/port films were reviewed. The chart was reviewed.   Bladder filling is excellent. No GU or GI difficulties.  Physical Examination:  Filed Vitals:   08/03/13 0929  BP: 114/65  Pulse: 93  Temp: 98.6 F (37 C)  Resp: 20  .  Weight: 152 lb 8 oz (69.174 kg). No change .  Impression: Tolerating radiation therapy well.  Plan: Continue radiation therapy as planned.

## 2013-08-04 ENCOUNTER — Ambulatory Visit
Admission: RE | Admit: 2013-08-04 | Discharge: 2013-08-04 | Disposition: A | Discharge status: Home / Self Care | Payer: Medicare Other | Source: Ambulatory Visit | Attending: Radiation Oncology | Admitting: Radiation Oncology

## 2013-08-05 ENCOUNTER — Ambulatory Visit
Admission: RE | Admit: 2013-08-05 | Discharge: 2013-08-05 | Disposition: A | Discharge status: Home / Self Care | Payer: Medicare Other | Source: Ambulatory Visit | Attending: Radiation Oncology | Admitting: Radiation Oncology

## 2013-08-10 ENCOUNTER — Ambulatory Visit
Admission: RE | Admit: 2013-08-10 | Discharge: 2013-08-10 | Disposition: A | Discharge status: Home / Self Care | Payer: Medicare Other | Source: Ambulatory Visit | Attending: Radiation Oncology | Admitting: Radiation Oncology

## 2013-08-11 ENCOUNTER — Encounter: Payer: Self-pay | Admitting: Radiation Oncology

## 2013-08-11 ENCOUNTER — Ambulatory Visit: Admission: RE | Admit: 2013-08-11 | Payer: Medicare Other | Source: Ambulatory Visit

## 2013-08-11 ENCOUNTER — Ambulatory Visit: Payer: 59 | Admitting: Internal Medicine

## 2013-08-11 ENCOUNTER — Ambulatory Visit
Admission: RE | Admit: 2013-08-11 | Discharge: 2013-08-11 | Disposition: A | Discharge status: Home / Self Care | Payer: Medicare Other | Source: Ambulatory Visit | Attending: Radiation Oncology | Admitting: Radiation Oncology

## 2013-08-11 VITALS — BP 141/86 | HR 81 | Temp 98.7°F | Resp 20 | Wt 155.2 lb

## 2013-08-11 DIAGNOSIS — C61 Malignant neoplasm of prostate: Secondary | ICD-10-CM

## 2013-08-11 NOTE — Progress Notes (Signed)
Weekly Management Note:  Site: Prostate Current Dose:  780  cGy Projected Dose: 7800  cGy  Narrative: The patient is seen today for routine under treatment assessment. CBCT/MVCT images/port films were reviewed. The chart was reviewed.   Treatment is delayed today because of machine downtime. He'll come back to be treated later this afternoon. Cone beam CT will be reviewed later. No GU or GI difficulties.  Physical Examination:  Filed Vitals:   08/11/13 0931  BP: 141/86  Pulse: 81  Temp: 98.7 F (37.1 C)  Resp: 20  .  Weight: 155 lb 3.2 oz (70.398 kg). No change.  Impression: Tolerating radiation therapy well.  Plan: Continue radiation therapy as planned.

## 2013-08-11 NOTE — Progress Notes (Addendum)
Pt denies pain, urinary/bowel issues, fatigue, loss of appetite. 

## 2013-08-12 ENCOUNTER — Ambulatory Visit
Admission: RE | Admit: 2013-08-12 | Discharge: 2013-08-12 | Disposition: A | Discharge status: Home / Self Care | Payer: Medicare Other | Source: Ambulatory Visit | Attending: Radiation Oncology | Admitting: Radiation Oncology

## 2013-08-13 ENCOUNTER — Ambulatory Visit
Admission: RE | Admit: 2013-08-13 | Discharge: 2013-08-13 | Disposition: A | Discharge status: Home / Self Care | Payer: Medicare Other | Source: Ambulatory Visit | Attending: Radiation Oncology | Admitting: Radiation Oncology

## 2013-08-14 ENCOUNTER — Ambulatory Visit
Admission: RE | Admit: 2013-08-14 | Discharge: 2013-08-14 | Disposition: A | Discharge status: Home / Self Care | Payer: Medicare Other | Source: Ambulatory Visit | Attending: Radiation Oncology | Admitting: Radiation Oncology

## 2013-08-17 ENCOUNTER — Ambulatory Visit
Admission: RE | Admit: 2013-08-17 | Discharge: 2013-08-17 | Disposition: A | Discharge status: Home / Self Care | Payer: Medicare Other | Source: Ambulatory Visit | Attending: Radiation Oncology | Admitting: Radiation Oncology

## 2013-08-17 ENCOUNTER — Encounter: Payer: Self-pay | Admitting: Radiation Oncology

## 2013-08-17 VITALS — BP 149/92 | HR 78 | Temp 98.4°F | Resp 20 | Wt 154.3 lb

## 2013-08-17 DIAGNOSIS — C61 Malignant neoplasm of prostate: Secondary | ICD-10-CM

## 2013-08-17 NOTE — Progress Notes (Signed)
Pt denies pain, urinary/bowel issues, fatigue, loss of appetite. 

## 2013-08-17 NOTE — Progress Notes (Signed)
Weekly Management Note:  Site: Prostate Current Dose:  1560  cGy Projected Dose: 7800  cGy  Narrative: The patient is seen today for routine under treatment assessment. CBCT/MVCT images/port films were reviewed. The chart was reviewed.   Bladder filling is excellent today. No GU or GI difficulties.  Physical Examination:  Filed Vitals:   08/17/13 0957  BP: 149/92  Pulse: 78  Temp: 98.4 F (36.9 C)  Resp: 20  .  Weight: 154 lb 4.8 oz (69.99 kg). No change .  Impression: Tolerating radiation therapy well.  Plan: Continue radiation therapy as planned.

## 2013-08-18 ENCOUNTER — Ambulatory Visit
Admission: RE | Admit: 2013-08-18 | Discharge: 2013-08-18 | Disposition: A | Discharge status: Home / Self Care | Payer: Medicare Other | Source: Ambulatory Visit | Attending: Radiation Oncology | Admitting: Radiation Oncology

## 2013-08-19 ENCOUNTER — Ambulatory Visit
Admission: RE | Admit: 2013-08-19 | Discharge: 2013-08-19 | Disposition: A | Discharge status: Home / Self Care | Payer: Medicare Other | Source: Ambulatory Visit | Attending: Radiation Oncology | Admitting: Radiation Oncology

## 2013-08-20 ENCOUNTER — Ambulatory Visit
Admission: RE | Admit: 2013-08-20 | Discharge: 2013-08-20 | Disposition: A | Discharge status: Home / Self Care | Payer: Medicare Other | Source: Ambulatory Visit | Attending: Radiation Oncology | Admitting: Radiation Oncology

## 2013-08-21 ENCOUNTER — Ambulatory Visit
Admission: RE | Admit: 2013-08-21 | Discharge: 2013-08-21 | Disposition: A | Discharge status: Home / Self Care | Payer: Medicare Other | Source: Ambulatory Visit | Attending: Radiation Oncology | Admitting: Radiation Oncology

## 2013-08-24 ENCOUNTER — Ambulatory Visit
Admission: RE | Admit: 2013-08-24 | Discharge: 2013-08-24 | Disposition: A | Discharge status: Home / Self Care | Payer: Medicare Other | Source: Ambulatory Visit | Attending: Radiation Oncology | Admitting: Radiation Oncology

## 2013-08-24 ENCOUNTER — Encounter: Payer: Self-pay | Admitting: Radiation Oncology

## 2013-08-24 VITALS — BP 127/82 | HR 86 | Temp 97.9°F | Resp 20 | Wt 154.0 lb

## 2013-08-24 DIAGNOSIS — C61 Malignant neoplasm of prostate: Secondary | ICD-10-CM

## 2013-08-24 NOTE — Progress Notes (Signed)
Weekly Management Note:  Site: Prostate Current Dose:  2535  cGy Projected Dose: 7800  cGy  Narrative: The patient is seen today for routine under treatment assessment. CBCT/MVCT images/port films were reviewed. The chart was reviewed.   Bladder filling remains excellent. He does have mild urinary frequency and urgency as expected. No GI difficulties.  Physical Examination:  Filed Vitals:   08/24/13 0947  BP: 127/82  Pulse: 86  Temp: 97.9 F (36.6 C)  Resp: 20  .  Weight: 154 lb (69.854 kg). No change.  Impression: Tolerating radiation therapy well.  Plan: Continue radiation therapy as planned.

## 2013-08-24 NOTE — Progress Notes (Addendum)
Pt denies pain, bladder/bowel issues, loss of appetite, fatigue. Correction: pt reported increased urinary frequency, nocturia x 3, urgency.

## 2013-08-24 NOTE — Addendum Note (Signed)
Encounter addended by: Glennie Hawk, RN on: 08/24/2013 10:41 AM<BR>     Documentation filed: Notes Section

## 2013-08-25 ENCOUNTER — Ambulatory Visit
Admission: RE | Admit: 2013-08-25 | Discharge: 2013-08-25 | Disposition: A | Discharge status: Home / Self Care | Payer: Medicare Other | Source: Ambulatory Visit | Attending: Radiation Oncology | Admitting: Radiation Oncology

## 2013-08-26 ENCOUNTER — Ambulatory Visit
Admission: RE | Admit: 2013-08-26 | Discharge: 2013-08-26 | Disposition: A | Discharge status: Home / Self Care | Payer: Medicare Other | Source: Ambulatory Visit | Attending: Radiation Oncology | Admitting: Radiation Oncology

## 2013-08-27 ENCOUNTER — Ambulatory Visit
Admission: RE | Admit: 2013-08-27 | Discharge: 2013-08-27 | Disposition: A | Discharge status: Home / Self Care | Payer: Medicare Other | Source: Ambulatory Visit | Attending: Radiation Oncology | Admitting: Radiation Oncology

## 2013-08-28 ENCOUNTER — Ambulatory Visit
Admission: RE | Admit: 2013-08-28 | Discharge: 2013-08-28 | Disposition: A | Discharge status: Home / Self Care | Payer: Medicare Other | Source: Ambulatory Visit | Attending: Radiation Oncology | Admitting: Radiation Oncology

## 2013-08-31 ENCOUNTER — Ambulatory Visit
Admission: RE | Admit: 2013-08-31 | Discharge: 2013-08-31 | Disposition: A | Discharge status: Home / Self Care | Payer: Medicare Other | Source: Ambulatory Visit | Attending: Radiation Oncology | Admitting: Radiation Oncology

## 2013-08-31 VITALS — BP 135/64 | HR 77 | Temp 98.3°F | Ht 68.0 in | Wt 154.2 lb

## 2013-08-31 DIAGNOSIS — C61 Malignant neoplasm of prostate: Secondary | ICD-10-CM

## 2013-08-31 NOTE — Progress Notes (Signed)
Weekly Management Note:  Site: Prostate Current Dose:  3510  cGy Projected Dose: 7800  cGy  Narrative: The patient is seen today for routine under treatment assessment. CBCT/MVCT images/port films were reviewed. The chart was reviewed.   Bladder filling is satisfactory. He does have nocturia x2-3.  Physical Examination:  Filed Vitals:   08/31/13 0949  BP: 135/64  Pulse: 77  Temp: 98.3 F (36.8 C)  .  Weight: 154 lb 3.2 oz (69.945 kg). No change.  Impression: Tolerating radiation therapy well.  Plan: Continue radiation therapy as planned.

## 2013-08-31 NOTE — Progress Notes (Signed)
Lucas Armstrong has had 18 fractions to his prostate.  He denies pain, diarrhea and hematuria.  He has noticed an increase in urinary frequency.  He gets up 2-3 times a night to urinate.  He has noticed fatigue.

## 2013-09-01 ENCOUNTER — Ambulatory Visit
Admission: RE | Admit: 2013-09-01 | Discharge: 2013-09-01 | Disposition: A | Discharge status: Home / Self Care | Payer: Medicare Other | Source: Ambulatory Visit | Attending: Radiation Oncology | Admitting: Radiation Oncology

## 2013-09-02 ENCOUNTER — Ambulatory Visit
Admission: RE | Admit: 2013-09-02 | Discharge: 2013-09-02 | Disposition: A | Discharge status: Home / Self Care | Payer: Medicare Other | Source: Ambulatory Visit | Attending: Radiation Oncology | Admitting: Radiation Oncology

## 2013-09-04 ENCOUNTER — Ambulatory Visit
Admission: RE | Admit: 2013-09-04 | Discharge: 2013-09-04 | Disposition: A | Discharge status: Home / Self Care | Payer: Medicare Other | Source: Ambulatory Visit | Attending: Radiation Oncology | Admitting: Radiation Oncology

## 2013-09-07 ENCOUNTER — Ambulatory Visit
Admission: RE | Admit: 2013-09-07 | Discharge: 2013-09-07 | Disposition: A | Discharge status: Home / Self Care | Payer: Medicare Other | Source: Ambulatory Visit | Attending: Radiation Oncology | Admitting: Radiation Oncology

## 2013-09-08 ENCOUNTER — Ambulatory Visit (INDEPENDENT_AMBULATORY_CARE_PROVIDER_SITE_OTHER): Payer: Medicare Other | Admitting: Internal Medicine

## 2013-09-08 ENCOUNTER — Other Ambulatory Visit: Payer: Self-pay | Admitting: *Deleted

## 2013-09-08 ENCOUNTER — Ambulatory Visit
Admission: RE | Admit: 2013-09-08 | Discharge: 2013-09-08 | Disposition: A | Discharge status: Home / Self Care | Payer: Medicare Other | Source: Ambulatory Visit | Attending: Radiation Oncology | Admitting: Radiation Oncology

## 2013-09-08 ENCOUNTER — Ambulatory Visit (INDEPENDENT_AMBULATORY_CARE_PROVIDER_SITE_OTHER)
Admission: RE | Admit: 2013-09-08 | Discharge: 2013-09-08 | Disposition: A | Discharge status: Home / Self Care | Payer: Medicare Other | Source: Ambulatory Visit | Attending: Internal Medicine | Admitting: Internal Medicine

## 2013-09-08 ENCOUNTER — Encounter: Payer: Self-pay | Admitting: Internal Medicine

## 2013-09-08 ENCOUNTER — Encounter: Payer: Self-pay | Admitting: Radiation Oncology

## 2013-09-08 VITALS — BP 148/81 | HR 73 | Temp 97.7°F | Resp 20 | Wt 154.8 lb

## 2013-09-08 VITALS — BP 122/84 | HR 80 | Ht 68.0 in | Wt 155.0 lb

## 2013-09-08 DIAGNOSIS — J45998 Other asthma: Secondary | ICD-10-CM

## 2013-09-08 DIAGNOSIS — J45909 Unspecified asthma, uncomplicated: Secondary | ICD-10-CM

## 2013-09-08 DIAGNOSIS — C61 Malignant neoplasm of prostate: Secondary | ICD-10-CM

## 2013-09-08 DIAGNOSIS — J309 Allergic rhinitis, unspecified: Secondary | ICD-10-CM

## 2013-09-08 MED ORDER — BECLOMETHASONE DIPROPIONATE 40 MCG/ACT IN AERS: INHALATION_SPRAY | RESPIRATORY_TRACT | Status: DC

## 2013-09-08 NOTE — Progress Notes (Signed)
   Department of Radiation Oncology  Phone:  [phone] Fax:        [phone]  Weekly Treatment Note    Name: Lucas Armstrong Date: 09/08/2013 MRN: [id number] DOB: Mar 11, 1946   Current dose: 44.85 Gy  Current fraction: 23   MEDICATIONS: Current Outpatient Prescriptions  Medication Sig Dispense Refill  . albuterol (VENTOLIN HFA) 108 (90 BASE) MCG/ACT inhaler Inhale 2 puffs into the lungs every 4 (four) hours as needed for wheezing or shortness of breath.  51 each  1  . atorvastatin (LIPITOR) 40 MG tablet Take 40 mg by mouth daily.        . beclomethasone (QVAR) 40 MCG/ACT inhaler 2 puffs and rinse mouth well, twice daily  1 Inhaler  0  . Multiple Vitamin (MULTIVITAMIN) capsule Take 1 capsule by mouth daily.        Marland Kitchen telmisartan-hydrochlorothiazide (MICARDIS HCT) 80-12.5 MG per tablet Take 1 tablet by mouth daily.        . mometasone (NASONEX) 50 MCG/ACT nasal spray 1-2 puffs each nostril once or twice daily  51 g  3   No current facility-administered medications for this encounter.     ALLERGIES: Review of patient's allergies indicates no known allergies.   LABORATORY DATA:  No results found for this basename: WBC, HGB, HCT, MCV, PLT   No results found for this basename: NA, K, CL, CO2   No results found for this basename: ALT, AST, GGT, ALKPHOS, BILITOT     NARRATIVE: Lucas Armstrong was seen today for weekly treatment management. The chart was checked and the patient's films were reviewed. The patient states that he is doing very well. He does complain of some fatigue. Stable nocturia 2-3 times per night. No diarrhea and no nausea.  PHYSICAL EXAMINATION: weight is 154 lb 12.8 oz (70.217 kg). His oral temperature is 97.7 F (36.5 C). His blood pressure is 148/81 and his pulse is 73. His respiration is 20.        ASSESSMENT: The patient is doing satisfactorily with treatment.  PLAN: We will continue with the patient's radiation treatment as planned.

## 2013-09-08 NOTE — Progress Notes (Signed)
Pt denies pain, loss of appetite, bowel issues. He takes nap after lunch but states fatigue is not worrisome. He has nocturia x 2-3.

## 2013-09-08 NOTE — Progress Notes (Signed)
Patient ID: Lucas Armstrong, male    DOB: [birth year], 67 y.o.   MRN: [id number]  HPI 02/06/11- 44 yyoM former smoker followed for asthma, allergic rhinitis. Last here August 08, 2010. Note reviewed. He commented this was a long allergy season. Throat congestion. He has noted more asthma, with unusually frequent need for rescue inhaler, used at least once nightly. He has used Qvar 40 twice daily since Thanksgiving. Asthma is usually a Spring and Fall problem, plus dust at Christmas.  Nose is ok with Nasonex.   08/09/11- 20 yoM former smoker followed for asthma, allergic rhinitis. Has had flu vaccine. Wheeze some in the fall until [place] came, but now feels well. Meds were sufficient even through that time. He is using his rescue inhaler about one time a week. He continues Qvar 42 puffs twice daily. These medicines he considers sufficient.  08/08/12- 66 yoM former smoker followed for asthma, allergic rhinitis. Pt reports breathing has been "pretty normal"--"some" asthma attacks but other than that denies any other concerns Has had flu vaccine. Several  asthma episodes over the past year when he needed prednisone, particularly after getting up leaves this fall. He has been using Qvar 2 puffs twice daily. Has had some nasal congestion and drainage + Will need chest x-ray and PFT for data base at future visit+  09/08/13- 67 yoM former smoker followed for asthma, allergic rhinitis. FOLLOWS FOR: Breathing has been doing well. Denies chest tightness, SOB, coughing or wheezing at this time. He has had a few acute episodes of wheezing in October. Always associated with fall leaves, raking outdoors. Took 2 rounds of prednisone. Has not needed rescue inhaler in the last 4 weeks.  Review of Systems- HPI Constitutional:   No-   weight loss, night sweats, fevers, chills, fatigue, lassitude. HEENT:   No-  headaches, difficulty swallowing, tooth/dental problems, sore throat,       No- recent sneezing,  itching, ear ache, +nasal congestion, post nasal drip,  CV:  No-   chest pain, orthopnea, PND, swelling in lower extremities, anasarca,   dizziness, palpitations Resp: No-   shortness of breath with exertion or at rest.              No-   productive cough,  No non-productive cough,  No- coughing up of blood.              No-   change in color of mucus.  +recent wheezing.   Skin: No-   rash or lesions. GI:  No reflux GU:  MS:  No-   joint pain or swelling Neuro-     nothing unusual Psych:  No- change in mood or affect. No depression or anxiety.  No memory loss   Objective:   Physical Exam General- Alert, Oriented, Affect-appropriate, Distress- none acute, looks well Skin- rash-none, lesions- none, excoriation- none Lymphadenopathy- none Head- atraumatic            Eyes- Gross vision intact, PERRLA, conjunctivae-             Ears- + hard of hearing            Nose- Clear, no-Septal dev, mucus, polyps, erosion, perforation             Throat- Mallampati II , mucosa clear , drainage- none, tonsils- atrophic Neck- flexible , trachea midline, no stridor , thyroid nl, carotid no bruit Chest - symmetrical excursion , unlabored  Heart/CV- RRR , no murmur , no gallop  , no rub, nl s1 s2                           - JVD- none , edema- none, stasis changes- none, varices- none           Lung- clear to P&A, wheeze- none, cough- none , dullness-none, rub- none           Chest wall-  Abd-  Br/ Gen/ Rectal- Not done, not indicated Extrem- cyanosis- none, clubbing, none, atrophy- none, strength- nl Neuro- grossly intact to observation

## 2013-09-08 NOTE — Patient Instructions (Signed)
We can continue current meds  Order- schedule PFT    Dx asthma  Order - CXR   Dx asthma

## 2013-09-09 ENCOUNTER — Ambulatory Visit
Admission: RE | Admit: 2013-09-09 | Discharge: 2013-09-09 | Disposition: A | Discharge status: Home / Self Care | Payer: Medicare Other | Source: Ambulatory Visit | Attending: Radiation Oncology | Admitting: Radiation Oncology

## 2013-09-11 ENCOUNTER — Ambulatory Visit
Admission: RE | Admit: 2013-09-11 | Discharge: 2013-09-11 | Disposition: A | Discharge status: Home / Self Care | Payer: Medicare Other | Source: Ambulatory Visit | Attending: Radiation Oncology | Admitting: Radiation Oncology

## 2013-09-14 ENCOUNTER — Ambulatory Visit
Admission: RE | Admit: 2013-09-14 | Discharge: 2013-09-14 | Disposition: A | Discharge status: Home / Self Care | Payer: Medicare Other | Source: Ambulatory Visit | Attending: Radiation Oncology | Admitting: Radiation Oncology

## 2013-09-14 DIAGNOSIS — C61 Malignant neoplasm of prostate: Secondary | ICD-10-CM

## 2013-09-14 NOTE — Progress Notes (Signed)
Weekly Management Note:  Site: Prostate Current Dose:  5070  cGy Projected Dose: 7800  cGy  Narrative: The patient is seen today for routine under treatment assessment. CBCT/MVCT images/port films were reviewed. The chart was reviewed.   Bladder filling is satisfactory. He does report slight increase in urinary frequency/urgency along with nocturia x2-4. His symptoms are not particularly bothersome. No GI difficulties.  Physical Examination: There were no vitals filed for this visit..  Weight:  . No change.  Impression: Tolerating radiation therapy well.  Plan: Continue radiation therapy as planned.

## 2013-09-14 NOTE — Progress Notes (Signed)
Weekly rad txs prostate 26/40 completed, increased frequency voiding, nocturia 2x-4x night, no dysurias, no hematuria, appetite good, energy level good, tired end of the day 9:36 AM

## 2013-09-15 ENCOUNTER — Ambulatory Visit
Admission: RE | Admit: 2013-09-15 | Discharge: 2013-09-15 | Disposition: A | Discharge status: Home / Self Care | Payer: Medicare Other | Source: Ambulatory Visit | Attending: Radiation Oncology | Admitting: Radiation Oncology

## 2013-09-16 ENCOUNTER — Ambulatory Visit
Admission: RE | Admit: 2013-09-16 | Discharge: 2013-09-16 | Disposition: A | Discharge status: Home / Self Care | Payer: Medicare Other | Source: Ambulatory Visit | Attending: Radiation Oncology | Admitting: Radiation Oncology

## 2013-09-17 ENCOUNTER — Ambulatory Visit
Admission: RE | Admit: 2013-09-17 | Discharge: 2013-09-17 | Disposition: A | Discharge status: Home / Self Care | Payer: Medicare Other | Source: Ambulatory Visit | Attending: Radiation Oncology | Admitting: Radiation Oncology

## 2013-09-18 ENCOUNTER — Ambulatory Visit
Admission: RE | Admit: 2013-09-18 | Discharge: 2013-09-18 | Disposition: A | Discharge status: Home / Self Care | Payer: Medicare Other | Source: Ambulatory Visit | Attending: Radiation Oncology | Admitting: Radiation Oncology

## 2013-09-21 ENCOUNTER — Ambulatory Visit
Admission: RE | Admit: 2013-09-21 | Discharge: 2013-09-21 | Disposition: A | Discharge status: Home / Self Care | Payer: Medicare Other | Source: Ambulatory Visit | Attending: Radiation Oncology | Admitting: Radiation Oncology

## 2013-09-21 ENCOUNTER — Encounter: Payer: Self-pay | Admitting: Radiation Oncology

## 2013-09-21 VITALS — BP 131/75 | HR 76 | Temp 97.8°F | Resp 16 | Wt 155.8 lb

## 2013-09-21 DIAGNOSIS — C61 Malignant neoplasm of prostate: Secondary | ICD-10-CM

## 2013-09-21 NOTE — Progress Notes (Signed)
Weekly rad txs prostate, 31/40, increased frequency voiding stated, q 1-2 hours, reg bm's, no dysuria,hematuria, appetite good, gets fatigued end of the day 9:39 AM

## 2013-09-21 NOTE — Progress Notes (Signed)
Weekly Management Note:  Site: Prostate Current Dose:  6045  cGy Projected Dose: 7800  cGy  Narrative: The patient is seen today for routine under treatment assessment. CBCT/MVCT images/port films were reviewed. The chart was reviewed.   Bladder filling is satisfactory. No significant GU or GI difficulties although he does have an increasing urinary frequency every one to 2 hours.  Physical Examination:  Filed Vitals:   09/21/13 0938  BP: 131/75  Pulse: 76  Temp: 97.8 F (36.6 C)  Resp: 16  .  Weight: 155 lb 12.8 oz (70.67 kg). No change .  Impression: Tolerating radiation therapy well.  Plan: Continue radiation therapy as planned.

## 2013-09-22 ENCOUNTER — Ambulatory Visit
Admission: RE | Admit: 2013-09-22 | Discharge: 2013-09-22 | Disposition: A | Discharge status: Home / Self Care | Payer: Medicare Other | Source: Ambulatory Visit | Attending: Radiation Oncology | Admitting: Radiation Oncology

## 2013-09-23 ENCOUNTER — Ambulatory Visit
Admission: RE | Admit: 2013-09-23 | Discharge: 2013-09-23 | Disposition: A | Discharge status: Home / Self Care | Payer: Medicare Other | Source: Ambulatory Visit | Attending: Radiation Oncology | Admitting: Radiation Oncology

## 2013-09-24 ENCOUNTER — Ambulatory Visit
Admission: RE | Admit: 2013-09-24 | Discharge: 2013-09-24 | Disposition: A | Discharge status: Home / Self Care | Payer: Medicare Other | Source: Ambulatory Visit | Attending: Radiation Oncology | Admitting: Radiation Oncology

## 2013-09-25 ENCOUNTER — Ambulatory Visit
Admission: RE | Admit: 2013-09-25 | Discharge: 2013-09-25 | Disposition: A | Discharge status: Home / Self Care | Payer: Medicare Other | Source: Ambulatory Visit | Attending: Radiation Oncology | Admitting: Radiation Oncology

## 2013-09-28 ENCOUNTER — Ambulatory Visit
Admission: RE | Admit: 2013-09-28 | Discharge: 2013-09-28 | Disposition: A | Discharge status: Home / Self Care | Payer: Medicare Other | Source: Ambulatory Visit | Attending: Radiation Oncology | Admitting: Radiation Oncology

## 2013-09-28 ENCOUNTER — Encounter: Payer: Self-pay | Admitting: Radiation Oncology

## 2013-09-28 VITALS — BP 148/59 | HR 81 | Temp 98.1°F | Resp 20 | Wt 154.0 lb

## 2013-09-28 DIAGNOSIS — C61 Malignant neoplasm of prostate: Secondary | ICD-10-CM

## 2013-09-28 NOTE — Progress Notes (Signed)
Pt denies pain, fatigue, loss of appetite. He has some urinary frequency, but denies other urinary or bowel issues.

## 2013-09-28 NOTE — Assessment & Plan Note (Signed)
Good control

## 2013-09-28 NOTE — Assessment & Plan Note (Signed)
Seasonal exacerbation requiring prednisone, now resolved. Unclear whether the problem is allergy or viral, or both. Plan-schedule PFT, chest x-ray. Medication talk

## 2013-09-28 NOTE — Progress Notes (Signed)
Weekly Management Note:  Site: Prostate Current Dose:  7020  cGy Projected Dose: 7800  cGy  Narrative: The patient is seen today for routine under treatment assessment. CBCT/MVCT images/port films were reviewed. The chart was reviewed.   Bladder filling is excellent. No new GU or GI difficulty. He is doing remarkably well.  Physical Examination:  Filed Vitals:   09/28/13 0915  BP: 148/59  Pulse: 81  Temp: 98.1 F (36.7 C)  Resp: 20  .  Weight: 154 lb (69.854 kg). No change  Impression: Tolerating radiation therapy well.  Plan: Continue radiation therapy as planned.

## 2013-09-29 ENCOUNTER — Ambulatory Visit
Admission: RE | Admit: 2013-09-29 | Discharge: 2013-09-29 | Disposition: A | Discharge status: Home / Self Care | Payer: Medicare Other | Source: Ambulatory Visit | Attending: Radiation Oncology | Admitting: Radiation Oncology

## 2013-09-30 ENCOUNTER — Ambulatory Visit
Admission: RE | Admit: 2013-09-30 | Discharge: 2013-09-30 | Disposition: A | Discharge status: Home / Self Care | Payer: Medicare Other | Source: Ambulatory Visit | Attending: Radiation Oncology | Admitting: Radiation Oncology

## 2013-10-01 ENCOUNTER — Ambulatory Visit: Payer: Medicare Other

## 2013-10-01 ENCOUNTER — Ambulatory Visit
Admission: RE | Admit: 2013-10-01 | Discharge: 2013-10-01 | Disposition: A | Discharge status: Home / Self Care | Payer: Medicare Other | Source: Ambulatory Visit | Attending: Radiation Oncology | Admitting: Radiation Oncology

## 2013-10-02 ENCOUNTER — Ambulatory Visit
Admission: RE | Admit: 2013-10-02 | Discharge: 2013-10-02 | Disposition: A | Discharge status: Home / Self Care | Payer: Medicare Other | Source: Ambulatory Visit | Attending: Radiation Oncology | Admitting: Radiation Oncology

## 2013-10-02 ENCOUNTER — Ambulatory Visit: Payer: Medicare Other

## 2013-10-05 ENCOUNTER — Other Ambulatory Visit: Payer: Self-pay | Admitting: Internal Medicine

## 2013-10-05 ENCOUNTER — Encounter: Payer: Self-pay | Admitting: Radiation Oncology

## 2013-10-05 NOTE — Progress Notes (Signed)
[facility] End of Treatment Note  Name:Lucas Armstrong  Date: 10/05/2013 KNL:[id number] DOB:[birth year]   Status:outpatient    CC: Donnie Coffin, MD  Dr. Rolan Bucco  REFERRING PHYSICIAN:   Dr. 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 tolerated treatment well with no significant GU or GI toxicity by completion of therapy. He was treated with a comfortably full bladder with excellent compliance.                        PLAN: Routine followup in one month. Patient instructed to call if questions or worsening complaints in interim.

## 2013-11-03 ENCOUNTER — Encounter: Payer: Self-pay | Admitting: Radiation Oncology

## 2013-11-03 ENCOUNTER — Ambulatory Visit
Admission: RE | Admit: 2013-11-03 | Discharge: 2013-11-03 | Disposition: A | Discharge status: Home / Self Care | Payer: Medicare Other | Source: Ambulatory Visit | Attending: Radiation Oncology | Admitting: Radiation Oncology

## 2013-11-03 VITALS — BP 145/71 | HR 70 | Temp 98.3°F | Resp 16 | Wt 156.4 lb

## 2013-11-03 DIAGNOSIS — C61 Malignant neoplasm of prostate: Secondary | ICD-10-CM

## 2013-11-03 NOTE — Progress Notes (Signed)
CC: Dr. Rolan Bucco  [name] returns today approximately 1 month following completion of external beam/IMRT in the management of his stage TI C. intermediate risk adenocarcinoma prostate. He is without complaints today. He is back to his baseline GU and GI habits. He tells me he'll see Dr. Jasmine December for a followup visit and PSA determination next month.  Physical examination: Alert and oriented. Filed Vitals:   11/03/13 0958  BP: 145/71  Pulse: 70  Temp: 98.3 F (36.8 C)  Resp: 16   Impression: Satisfactory progress with no sequelae from his radiation therapy.  Plan: Followup with Dr. Jasmine December next month. I've not scheduled the patient for a formal followup visit, and I ask Dr. Jasmine December to keep me posted on his progress.  Marland Kitchen

## 2013-11-03 NOTE — Progress Notes (Signed)
Follow up prostate txs, no c/o pain, discomfort, only gets up to void 1x night, feels better, things have improved stated patient, bowels regular,no dysuria, no hematuria 9:59 AM

## 2014-01-04 ENCOUNTER — Ambulatory Visit (INDEPENDENT_AMBULATORY_CARE_PROVIDER_SITE_OTHER): Payer: Medicare Other | Admitting: Internal Medicine

## 2014-01-04 DIAGNOSIS — J45909 Unspecified asthma, uncomplicated: Secondary | ICD-10-CM

## 2014-01-04 LAB — PULMONARY FUNCTION TEST
DL/VA % pred: 133 %
DL/VA: 5.99 ml/min/mmHg/L
DLCO UNC % PRED: 113 %
DLCO unc: 33.61 ml/min/mmHg
FEF 25-75 Post: 1.61 L/sec
FEF 25-75 Pre: 0.77 L/sec
FEF2575-%Change-Post: 110 %
FEF2575-%Pred-Post: 67 %
FEF2575-%Pred-Pre: 32 %
FEV1-%CHANGE-POST: 30 %
FEV1-%PRED-POST: 74 %
FEV1-%Pred-Pre: 56 %
FEV1-Post: 2.28 L
FEV1-Pre: 1.74 L
FEV1FVC-%Change-Post: 8 %
FEV1FVC-%Pred-Pre: 79 %
FEV6-%Change-Post: 22 %
FEV6-%PRED-PRE: 72 %
FEV6-%Pred-Post: 89 %
FEV6-POST: 3.5 L
FEV6-Pre: 2.86 L
FEV6FVC-%Change-Post: 0 %
FEV6FVC-%PRED-POST: 104 %
FEV6FVC-%Pred-Pre: 103 %
FVC-%CHANGE-POST: 20 %
FVC-%PRED-PRE: 71 %
FVC-%Pred-Post: 86 %
FVC-PRE: 2.96 L
FVC-Post: 3.58 L
Post FEV1/FVC ratio: 64 %
Post FEV6/FVC ratio: 98 %
Pre FEV1/FVC ratio: 59 %
Pre FEV6/FVC Ratio: 97 %

## 2014-01-04 NOTE — Progress Notes (Signed)
PFT done today. 

## 2014-05-07 ENCOUNTER — Other Ambulatory Visit: Payer: Self-pay | Admitting: Internal Medicine

## 2014-06-07 ENCOUNTER — Telehealth: Payer: Self-pay | Admitting: Internal Medicine

## 2014-06-07 MED ORDER — PREDNISONE 10 MG PO TABS: ORAL_TABLET | ORAL | Status: DC

## 2014-06-07 NOTE — Telephone Encounter (Signed)
Offer prednisone 10 mg, # 20, 4 X 2 DAYS, 3 X 2 DAYS, 2 X 2 DAYS, 1 X 2 DAYS  

## 2014-06-07 NOTE — Telephone Encounter (Signed)
Called pt. Aware of recs. RX sent in. Nothing further needed 

## 2014-06-07 NOTE — Telephone Encounter (Signed)
Called and spoke with pt and he stated that he has been having this asthma flare for the last couple of weeks.  This got worse over the weekend and has been difficult to sleep.  Pt stated that his albuterol his not helping.  Pt stated that the pred taper normally does the trick.  CY please advise. Thanks  Last ov--09/08/2013 Next ov--09/09/2014  No Known Allergies  Current Outpatient Prescriptions on File Prior to Visit  Medication Sig Dispense Refill  . atorvastatin (LIPITOR) 40 MG tablet Take 40 mg by mouth daily.        . beclomethasone (QVAR) 40 MCG/ACT inhaler 2 puffs and rinse mouth well, twice daily  1 Inhaler  11  . Multiple Vitamin (MULTIVITAMIN) capsule Take 1 capsule by mouth daily.        Marland Kitchen NASONEX 50 MCG/ACT nasal spray USE 1-2 SPRAYS IN EACH NOSTRIL ONCE OR TWICE DAILY  51 g  3  . telmisartan-hydrochlorothiazide (MICARDIS HCT) 80-12.5 MG per tablet Take 1 tablet by mouth daily.        . VENTOLIN HFA 108 (90 BASE) MCG/ACT inhaler INHALE 2 PUFFS BY MOUTH EVERY 4 HOURS AS NEEDED FOR WHEEZING OR SHORTNESS OF BREATH  56 g  1   No current facility-administered medications on file prior to visit.

## 2014-06-07 NOTE — Telephone Encounter (Signed)
ATC line busy x 3 wcb 

## 2014-06-25 ENCOUNTER — Telehealth: Payer: Self-pay | Admitting: Internal Medicine

## 2014-06-25 MED ORDER — PREDNISONE 10 MG PO TABS: ORAL_TABLET | ORAL | Status: DC

## 2014-06-25 NOTE — Telephone Encounter (Signed)
Spoke with the pt  He states having increased wheezing for the past 2-3 days  He denies any increased SOB or cough  He is using rescue inhaler every hr with minimal relief Wants pred taper  When we call him back call [phone]  Last ov dec 2014  Next ov dec 2015  No Known Allergies Current Outpatient Prescriptions on File Prior to Visit  Medication Sig Dispense Refill  . atorvastatin (LIPITOR) 40 MG tablet Take 40 mg by mouth daily.        . beclomethasone (QVAR) 40 MCG/ACT inhaler 2 puffs and rinse mouth well, twice daily  1 Inhaler  11  . Multiple Vitamin (MULTIVITAMIN) capsule Take 1 capsule by mouth daily.        Marland Kitchen NASONEX 50 MCG/ACT nasal spray USE 1-2 SPRAYS IN EACH NOSTRIL ONCE OR TWICE DAILY  51 g  3  . predniSONE (DELTASONE) 10 MG tablet Take 4 tabs daily x 2 days, 3 tabs daily x 2 days, 2 tabs daily x 2 days, 1 tab daily x 2 days  20 tablet  0  . telmisartan-hydrochlorothiazide (MICARDIS HCT) 80-12.5 MG per tablet Take 1 tablet by mouth daily.        . VENTOLIN HFA 108 (90 BASE) MCG/ACT inhaler INHALE 2 PUFFS BY MOUTH EVERY 4 HOURS AS NEEDED FOR WHEEZING OR SHORTNESS OF BREATH  56 g  1   No current facility-administered medications on file prior to visit.

## 2014-06-25 NOTE — Telephone Encounter (Signed)
Ok to send Rx prednisone 10 mg, # 20, 4 X 2 DAYS, 3 X 2 DAYS, 2 X 2 DAYS, 1 X 2 DAYS

## 2014-06-25 NOTE — Telephone Encounter (Signed)
lmomtcb x1 

## 2014-06-25 NOTE — Telephone Encounter (Signed)
If we can not reach pt at the [phone] please call [phone]

## 2014-06-25 NOTE — Telephone Encounter (Signed)
Called, spoke with pt -  Informed of below recs per Dr. Annamaria Boots.  He verbalized understanding, is aware pred rx sent to [facility], and is to call back if symptoms do not improve or worsen.  He is in agreement with plan and voiced no further questions or concerns at this time.

## 2014-09-09 ENCOUNTER — Encounter: Payer: Self-pay | Admitting: Internal Medicine

## 2014-09-09 ENCOUNTER — Ambulatory Visit (INDEPENDENT_AMBULATORY_CARE_PROVIDER_SITE_OTHER): Payer: Medicare Other | Admitting: Internal Medicine

## 2014-09-09 VITALS — BP 134/72 | HR 83 | Ht 68.0 in | Wt 158.0 lb

## 2014-09-09 DIAGNOSIS — J45998 Other asthma: Secondary | ICD-10-CM

## 2014-09-09 NOTE — Progress Notes (Signed)
Patient ID: Lucas Armstrong, male    DOB: 1946-04-24, 68 y.o.   MRN: [id number]  HPI 02/06/11- 30 yyoM former smoker followed for asthma, allergic rhinitis. Last here August 08, 2010. Note reviewed. He commented this was a long allergy season. Throat congestion. He has noted more asthma, with unusually frequent need for rescue inhaler, used at least once nightly. He has used Qvar 40 twice daily since Thanksgiving. Asthma is usually a Spring and Fall problem, plus dust at Christmas.  Nose is ok with Nasonex.   08/09/11- 89 yoM former smoker followed for asthma, allergic rhinitis. Has had flu vaccine. Wheeze some in the fall until [place] came, but now feels well. Meds were sufficient even through that time. He is using his rescue inhaler about one time a week. He continues Qvar 42 puffs twice daily. These medicines he considers sufficient.  08/08/12- 66 yoM former smoker followed for asthma, allergic rhinitis. Pt reports breathing has been "pretty normal"--"some" asthma attacks but other than that denies any other concerns Has had flu vaccine. Several  asthma episodes over the past year when he needed prednisone, particularly after getting up leaves this fall. He has been using Qvar 2 puffs twice daily. Has had some nasal congestion and drainage + Will need chest x-ray and PFT for data base at future visit+  09/08/13- 67 yoM former smoker followed for asthma, allergic rhinitis. FOLLOWS FOR: Breathing has been doing well. Denies chest tightness, SOB, coughing or wheezing at this time. He has had a few acute episodes of wheezing in October. Always associated with fall leaves, raking outdoors. Took 2 rounds of prednisone. Has not needed rescue inhaler in the last 4 weeks.  09/09/14-  67 yoM former smoker followed for asthma, allergic rhinitis. FOLLOWS FOR: yearly rov.  pt has no breathing complaints at this time.  Walking every day and stays active. Feels well now. He says each year times for  5 years, he has had significant flare of asthma in the fall (October to November) requiring prednisone. He blames exposure to leaves such as working outdoors. He is continued Qvar. //?? Pneumonia vaccine status??//  Review of Systems- HPI Constitutional:   No-   weight loss, night sweats, fevers, chills, fatigue, lassitude. HEENT:   No-  headaches, difficulty swallowing, tooth/dental problems, sore throat,       No- recent sneezing, itching, ear ache, +nasal congestion, post nasal drip,  CV:  No-   chest pain, orthopnea, PND, swelling in lower extremities, anasarca,   dizziness, palpitations Resp: No-   shortness of breath with exertion or at rest.              No-   productive cough,  No non-productive cough,  No- coughing up of blood.              No-   change in color of mucus.  +recent wheezing.   Skin: No-   rash or lesions. GI:  No reflux GU:  MS:  No-   joint pain or swelling Neuro-     nothing unusual Psych:  No- change in mood or affect. No depression or anxiety.  No memory loss   Objective:   Physical Exam General- Alert, Oriented, Affect-appropriate, Distress- none acute, looks well Skin- rash-none, lesions- none, excoriation- none Lymphadenopathy- none Head- atraumatic            Eyes- Gross vision intact, PERRLA, conjunctivae-             Ears- +  hard of hearing            Nose- Clear, no-Septal dev, mucus, polyps, erosion, perforation             Throat- Mallampati II , mucosa clear , drainage- none, tonsils- atrophic Neck- flexible , trachea midline, no stridor , thyroid nl, carotid no bruit Chest - symmetrical excursion , unlabored           Heart/CV- RRR , no murmur , no gallop  , no rub, nl s1 s2                           - JVD- none , edema- none, stasis changes- none, varices- none           Lung- clear to P&A, wheeze- none, cough- none , dullness-none, rub- none           Chest wall-  Abd-  Br/ Gen/ Rectal- Not done, not indicated Extrem- cyanosis- none,  clubbing, none, atrophy- none, strength- nl Neuro- grossly intact to observation

## 2014-09-09 NOTE — Patient Instructions (Signed)
Let us see you back in October next year, at the time of year when you have been having problems. We may want to give you a different medicine strategy then.   Please call as needed

## 2014-09-10 NOTE — Assessment & Plan Note (Signed)
He describes exacerbation each fall, late for ragweed season but not obviously viral. Probably mixture of causes but he blames working with leaves. Plan-we will schedule return next fall early in his problem season. He will continue present meds for now since he is doing well. He will get somebody else to do his yard work.

## 2014-10-04 ENCOUNTER — Other Ambulatory Visit: Payer: Self-pay | Admitting: Internal Medicine

## 2014-11-02 ENCOUNTER — Other Ambulatory Visit: Payer: Self-pay | Admitting: Internal Medicine

## 2014-11-12 ENCOUNTER — Other Ambulatory Visit: Payer: Self-pay | Admitting: *Deleted

## 2014-11-12 MED ORDER — ALBUTEROL SULFATE HFA 108 (90 BASE) MCG/ACT IN AERS: 2.0000 | INHALATION_SPRAY | RESPIRATORY_TRACT | Status: DC | PRN

## 2014-11-12 NOTE — Telephone Encounter (Signed)
Per Dr. Annamaria Boots - give 11 refills.  Rx sent.  Nothing further needed.

## 2015-07-08 ENCOUNTER — Encounter: Payer: Self-pay | Admitting: Internal Medicine

## 2015-07-08 ENCOUNTER — Ambulatory Visit (INDEPENDENT_AMBULATORY_CARE_PROVIDER_SITE_OTHER): Payer: Self-pay | Admitting: Internal Medicine

## 2015-07-08 VITALS — BP 118/78 | HR 76 | Ht 68.0 in | Wt 156.2 lb

## 2015-07-08 DIAGNOSIS — J45901 Unspecified asthma with (acute) exacerbation: Secondary | ICD-10-CM

## 2015-07-08 DIAGNOSIS — J441 Chronic obstructive pulmonary disease with (acute) exacerbation: Secondary | ICD-10-CM

## 2015-07-08 DIAGNOSIS — J45998 Other asthma: Secondary | ICD-10-CM

## 2015-07-08 MED ORDER — METHYLPREDNISOLONE ACETATE 80 MG/ML IJ SUSP
80.0000 mg | Freq: Once | INTRAMUSCULAR | Status: AC
  Administered 2015-07-08: 80 mg via INTRAMUSCULAR

## 2015-07-08 MED ORDER — FLUTICASONE FUROATE-VILANTEROL 200-25 MCG/INH IN AEPB: 1.0000 | INHALATION_SPRAY | Freq: Every day | RESPIRATORY_TRACT | Status: DC

## 2015-07-08 MED ORDER — LEVALBUTEROL HCL 0.63 MG/3ML IN NEBU
0.6300 mg | INHALATION_SOLUTION | Freq: Once | RESPIRATORY_TRACT | Status: AC
  Administered 2015-07-08: 0.63 mg via RESPIRATORY_TRACT

## 2015-07-08 NOTE — Patient Instructions (Signed)
Sample x 2 and print script for Breo Ellipta 200,   Inhale 1 puff and rinse mouth well, once daily     Maintenance inhaler   Office spirometry  Neb xop 0.63  Depo 80

## 2015-07-08 NOTE — Assessment & Plan Note (Signed)
Seasonal exacerbation without obvious infection. He is better maintenance control. Plan-replace Qvar with Breo 200 as discussed, Depo-Medrol, nebulizer treatments Xopenex

## 2015-07-08 NOTE — Progress Notes (Signed)
Patient ID: Lucas Armstrong, male    DOB: 09-21-1945, 69 y.o.   MRN: [id number]  HPI 02/06/11- 31 yyoM former smoker followed for asthma, allergic rhinitis. Last here August 08, 2010. Note reviewed. He commented this was a long allergy season. Throat congestion. He has noted more asthma, with unusually frequent need for rescue inhaler, used at least once nightly. He has used Qvar 40 twice daily since Thanksgiving. Asthma is usually a Spring and Fall problem, plus dust at Christmas.  Nose is ok with Nasonex.   08/09/11- 19 yoM former smoker followed for asthma, allergic rhinitis. Has had flu vaccine. Wheeze some in the fall until [place] came, but now feels well. Meds were sufficient even through that time. He is using his rescue inhaler about one time a week. He continues Qvar 42 puffs twice daily. These medicines he considers sufficient.  08/08/12- 66 yoM former smoker followed for asthma, allergic rhinitis. Pt reports breathing has been "pretty normal"--"some" asthma attacks but other than that denies any other concerns Has had flu vaccine. Several  asthma episodes over the past year when he needed prednisone, particularly after getting up leaves this fall. He has been using Qvar 2 puffs twice daily. Has had some nasal congestion and drainage + Will need chest x-ray and PFT for data base at future visit+  09/08/13- 67 yoM former smoker followed for asthma, allergic rhinitis. FOLLOWS FOR: Breathing has been doing well. Denies chest tightness, SOB, coughing or wheezing at this time. He has had a few acute episodes of wheezing in October. Always associated with fall leaves, raking outdoors. Took 2 rounds of prednisone. Has not needed rescue inhaler in the last 4 weeks.  09/09/14-  67 yoM former smoker followed for asthma, allergic rhinitis. FOLLOWS FOR: yearly rov.  pt has no breathing complaints at this time.  Walking every day and stays active. Feels well now. He says each year times for  5 years, he has had significant flare of asthma in the fall (October to November) requiring prednisone. He blames exposure to leaves such as working outdoors. He is continued Qvar. //?? Pneumonia vaccine status??//  07/08/15- 67 yoM former smoker followed for asthma/COPD, allergic rhinitis. FOLLOWS FOR: Continous wheezing this time of year. Denies any fever, chills, or congestion. Pt has to use rescue inhaler about 10 times a day during this time.  Typical exacerbation of asthma this time of year when she always blames on fall leaves. Wheezing and frequent use of metered inhaler. Qvar not holding him. Has had flu and pneumonia vaccine-PCP Office Spirometry 07/08/2015-severe obstructive airways disease with restricted vital capacity. FVC 2.33/55%, FEV1 1.36/41%, FEV1/FEC 0.58, FEF 25-75 percent 0.45/15%.  Review of Systems- HPI Constitutional:   No-   weight loss, night sweats, fevers, chills, fatigue, lassitude. HEENT:   No-  headaches, difficulty swallowing, tooth/dental problems, sore throat,       No- recent sneezing, itching, ear ache, +nasal congestion, post nasal drip,  CV:  No-   chest pain, orthopnea, PND, swelling in lower extremities, anasarca,   dizziness, palpitations Resp: + shortness of breath with exertion or at rest.              No-   productive cough,  No non-productive cough,  No- coughing up of blood.              No-   change in color of mucus. + wheezing.   Skin: No-   rash or lesions. GI:  No reflux GU:  MS:  No-   joint pain or swelling Neuro-     nothing unusual Psych:  No- change in mood or affect. No depression or anxiety.  No memory loss   Objective:   Physical Exam General- Alert, Oriented, Affect-appropriate, Distress- none acute, looks well Skin- rash-none, lesions- none, excoriation- none Lymphadenopathy- none Head- atraumatic            Eyes- Gross vision intact, PERRLA, conjunctivae-             Ears- + hard of hearing            Nose- Clear, no-Septal  dev, mucus, polyps, erosion, perforation             Throat- Mallampati II , mucosa clear , drainage- none, tonsils- atrophic Neck- flexible , trachea midline, no stridor , thyroid nl, carotid no bruit Chest - symmetrical excursion , unlabored           Heart/CV- RRR , no murmur , no gallop  , no rub, nl s1 s2                           - JVD- none , edema- none, stasis changes- none, varices- none           Lung- + distant, wheeze + mild, cough- none , dullness-none, rub- none           Chest wall-  Abd-  Br/ Gen/ Rectal- Not done, not indicated Extrem- cyanosis- none, clubbing, none, atrophy- none, strength- nl Neuro- grossly intact to observation

## 2015-10-11 ENCOUNTER — Other Ambulatory Visit: Payer: Self-pay | Admitting: Internal Medicine

## 2015-10-11 ENCOUNTER — Telehealth: Payer: Self-pay | Admitting: Internal Medicine

## 2015-10-11 MED FILL — QVAR 40 MCG ORAL INHALER: 40 | 30 days supply | Qty: 9 | Fill #0

## 2015-10-11 NOTE — Telephone Encounter (Signed)
lmtcb X1 for pt  

## 2015-10-12 DIAGNOSIS — H2512 Age-related nuclear cataract, left eye: Secondary | ICD-10-CM | POA: Diagnosis not present

## 2015-10-12 DIAGNOSIS — C6901 Malignant neoplasm of right conjunctiva: Secondary | ICD-10-CM | POA: Diagnosis not present

## 2015-10-12 DIAGNOSIS — Z961 Presence of intraocular lens: Secondary | ICD-10-CM | POA: Diagnosis not present

## 2015-10-12 MED ORDER — MOMETASONE FUROATE 50 MCG/ACT NA SUSP: NASAL | Status: DC

## 2015-10-12 MED FILL — MOMETASONE FUROATE 50 MCG S: 50 | 30 days supply | Qty: 17 | Fill #0

## 2015-10-12 NOTE — Telephone Encounter (Signed)
Called spoke with pt. He needs refill on nasonex. I have sent this in. QVAR was refilled on 10/11/15. Pt needed nothing further

## 2015-10-12 NOTE — Telephone Encounter (Signed)
Pt returned the call.Lucas Armstrong

## 2015-10-17 MED FILL — BREO ELLIPTA 200-25 MCG INH: 200-25 | 30 days supply | Qty: 60 | Fill #2

## 2015-10-28 MED FILL — TELMISARTAN-HCTZ 80-12.5 MG: 80-12.5 | 90 days supply | Qty: 90 | Fill #1

## 2015-10-28 MED FILL — NASONEX 50 MCG NASAL SPRAY: 50 | 15 days supply | Qty: 17 | Fill #5

## 2015-10-31 MED FILL — ATORVASTATIN 40 MG TABLET: 40 | 90 days supply | Qty: 90 | Fill #0

## 2015-11-14 MED FILL — BREO ELLIPTA 200-25 MCG INH: 200-25 | 30 days supply | Qty: 60 | Fill #3

## 2015-11-16 DIAGNOSIS — E7800 Pure hypercholesterolemia, unspecified: Secondary | ICD-10-CM | POA: Diagnosis not present

## 2015-11-16 DIAGNOSIS — I10 Essential (primary) hypertension: Secondary | ICD-10-CM | POA: Diagnosis not present

## 2015-12-02 MED FILL — FLUOROMETHOLONE 0.1% DROPS: 0.1 | 30 days supply | Qty: 5 | Fill #1

## 2015-12-19 MED FILL — BREO ELLIPTA 200-25 MCG INH: 200-25 | 30 days supply | Qty: 60 | Fill #4

## 2016-01-09 ENCOUNTER — Encounter: Payer: Self-pay | Admitting: Internal Medicine

## 2016-01-09 ENCOUNTER — Ambulatory Visit (INDEPENDENT_AMBULATORY_CARE_PROVIDER_SITE_OTHER): Payer: Medicare Other | Admitting: Internal Medicine

## 2016-01-09 VITALS — BP 130/80 | HR 78 | Ht 68.0 in | Wt 158.4 lb

## 2016-01-09 DIAGNOSIS — J45901 Unspecified asthma with (acute) exacerbation: Secondary | ICD-10-CM

## 2016-01-09 DIAGNOSIS — J45998 Other asthma: Secondary | ICD-10-CM

## 2016-01-09 DIAGNOSIS — J441 Chronic obstructive pulmonary disease with (acute) exacerbation: Secondary | ICD-10-CM

## 2016-01-09 MED ORDER — UMECLIDINIUM-VILANTEROL 62.5-25 MCG/INH IN AEPB: 1.0000 | INHALATION_SPRAY | Freq: Every day | RESPIRATORY_TRACT | Status: DC

## 2016-01-09 MED ORDER — ALBUTEROL SULFATE HFA 108 (90 BASE) MCG/ACT IN AERS: 2.0000 | INHALATION_SPRAY | RESPIRATORY_TRACT | Status: DC | PRN

## 2016-01-09 NOTE — Progress Notes (Signed)
Patient ID: Lucas Armstrong, male    DOB: 05-27-1946, 70 y.o.   MRN: [id number]  HPI 02/06/11- 32 yyoM former smoker followed for asthma, allergic rhinitis. Last here August 08, 2010. Note reviewed. He commented this was a long allergy season. Throat congestion. He has noted more asthma, with unusually frequent need for rescue inhaler, used at least once nightly. He has used Qvar 40 twice daily since Thanksgiving. Asthma is usually a Spring and Fall problem, plus dust at Christmas.  Nose is ok with Nasonex.   08/09/11- 75 yoM former smoker followed for asthma, allergic rhinitis. Has had flu vaccine. Wheeze some in the fall until [place] came, but now feels well. Meds were sufficient even through that time. He is using his rescue inhaler about one time a week. He continues Qvar 42 puffs twice daily. These medicines he considers sufficient.  08/08/12- 66 yoM former smoker followed for asthma, allergic rhinitis. Pt reports breathing has been "pretty normal"--"some" asthma attacks but other than that denies any other concerns Has had flu vaccine. Several  asthma episodes over the past year when he needed prednisone, particularly after getting up leaves this fall. He has been using Qvar 2 puffs twice daily. Has had some nasal congestion and drainage + Will need chest x-ray and PFT for data base at future visit+  09/08/13- 67 yoM former smoker followed for asthma, allergic rhinitis. FOLLOWS FOR: Breathing has been doing well. Denies chest tightness, SOB, coughing or wheezing at this time. He has had a few acute episodes of wheezing in October. Always associated with fall leaves, raking outdoors. Took 2 rounds of prednisone. Has not needed rescue inhaler in the last 4 weeks.  09/09/14-  67 yoM former smoker followed for asthma, allergic rhinitis. FOLLOWS FOR: yearly rov.  pt has no breathing complaints at this time.  Walking every day and stays active. Feels well now. He says each year times for  5 years, he has had significant flare of asthma in the fall (October to November) requiring prednisone. He blames exposure to leaves such as working outdoors. He is continued Qvar. //?? Pneumonia vaccine status??//  07/08/15- 67 yoM former smoker followed for asthma/COPD, allergic rhinitis. FOLLOWS FOR: Continous wheezing this time of year. Denies any fever, chills, or congestion. Pt has to use rescue inhaler about 10 times a day during this time.  Typical exacerbation of asthma this time of year when she always blames on fall leaves. Wheezing and frequent use of metered inhaler. Qvar not holding him. Has had flu and pneumonia vaccine-PCP Office Spirometry 07/08/2015-severe obstructive airways disease with restricted vital capacity. FVC 2.33/55%, FEV1 1.36/41%, FEV1/FEC 0.58, FEF 25-75 percent 0.45/15%.  01/09/2016-70 year old male former smoker followed for COPD with asthma, allergic rhinitis FOLLOW FOR: Asthma, doing fine.  no concerns.   Review of Systems- HPI Constitutional:   No-   weight loss, night sweats, fevers, chills, fatigue, lassitude. HEENT:   No-  headaches, difficulty swallowing, tooth/dental problems, sore throat,       No- recent sneezing, itching, ear ache, +nasal congestion, post nasal drip,  CV:  No-   chest pain, orthopnea, PND, swelling in lower extremities, anasarca,   dizziness, palpitations Resp: + shortness of breath with exertion or at rest.              No-   productive cough,  No non-productive cough,  No- coughing up of blood.              No-  change in color of mucus. + wheezing.   Skin: No-   rash or lesions. GI:  No reflux GU:  MS:  No-   joint pain or swelling Neuro-     nothing unusual Psych:  No- change in mood or affect. No depression or anxiety.  No memory loss   Objective:   Physical Exam General- Alert, Oriented, Affect-appropriate, Distress- none acute, looks well Skin- rash-none, lesions- none, excoriation- none Lymphadenopathy- none Head-  atraumatic            Eyes- Gross vision intact, PERRLA, conjunctivae-             Ears- + hard of hearing            Nose- Clear, no-Septal dev, mucus, polyps, erosion, perforation             Throat- Mallampati II , mucosa clear , drainage- none, tonsils- atrophic Neck- flexible , trachea midline, no stridor , thyroid nl, carotid no bruit Chest - symmetrical excursion , unlabored           Heart/CV- RRR , no murmur , no gallop  , no rub, nl s1 s2                           - JVD- none , edema- none, stasis changes- none, varices- none           Lung- + distant, wheeze + mild, cough- none , dullness-none, rub- none           Chest wall-  Abd-  Br/ Gen/ Rectal- Not done, not indicated Extrem- cyanosis- none, clubbing, none, atrophy- none, strength- nl Neuro- grossly intact to observation

## 2016-01-09 NOTE — Patient Instructions (Addendum)
Sample and print script for Anoro inhaler    Inhale 1 puff, once daily     Try this to compare with Breo  Refill fro albuterol rescue inhaler printed  Please call as needed

## 2016-01-18 MED FILL — VENTOLIN HFA 90 MCG INHALER: 108 (90 BAS | 16 days supply | Qty: 18 | Fill #0

## 2016-01-18 MED FILL — NASONEX 50 MCG NASAL SPRAY: 50 | 30 days supply | Qty: 17 | Fill #1

## 2016-01-24 ENCOUNTER — Telehealth: Payer: Self-pay | Admitting: Internal Medicine

## 2016-01-24 MED ORDER — UMECLIDINIUM-VILANTEROL 62.5-25 MCG/INH IN AEPB: 1.0000 | INHALATION_SPRAY | Freq: Every day | RESPIRATORY_TRACT | Status: DC

## 2016-01-24 MED FILL — ANORO ELLIPTA 62.5-25 MCG I: 62.5-25 | 30 days supply | Qty: 60 | Fill #0

## 2016-01-24 NOTE — Telephone Encounter (Signed)
Spoke with pt and he states that he did finish the sample of Anoro and it is working well. He cannot find the paper rx he was given and would like a rx sent to [facility]. Rx sent. Nothing further needed.

## 2016-01-24 NOTE — Telephone Encounter (Signed)
LMTCB

## 2016-01-26 MED FILL — TELMISARTAN-HCTZ 80-12.5 MG: 80-12.5 | 90 days supply | Qty: 90 | Fill #2

## 2016-01-26 MED FILL — BREO ELLIPTA 200-25 MCG INH: 200-25 | 30 days supply | Qty: 60 | Fill #5

## 2016-01-26 MED FILL — ATORVASTATIN 40 MG TABLET: 40 | 90 days supply | Qty: 90 | Fill #1

## 2016-02-13 DIAGNOSIS — C6901 Malignant neoplasm of right conjunctiva: Secondary | ICD-10-CM | POA: Diagnosis not present

## 2016-02-13 DIAGNOSIS — J45909 Unspecified asthma, uncomplicated: Secondary | ICD-10-CM | POA: Diagnosis not present

## 2016-02-13 DIAGNOSIS — E7800 Pure hypercholesterolemia, unspecified: Secondary | ICD-10-CM | POA: Diagnosis not present

## 2016-02-13 MED FILL — CYCLOBENZAPRINE 5 MG TABLET: 5 | 5 days supply | Qty: 15 | Fill #0

## 2016-02-15 DIAGNOSIS — S20219A Contusion of unspecified front wall of thorax, initial encounter: Secondary | ICD-10-CM | POA: Diagnosis not present

## 2016-02-16 MED FILL — HYDROCODON-APAP 5-325: 5-325 | 5 days supply | Qty: 30 | Fill #0

## 2016-02-16 MED FILL — CYCLOBENZAPRINE 5 MG TABLET: 5 | 10 days supply | Qty: 30 | Fill #0

## 2016-02-22 MED FILL — MINOCYCLINE 50 MG CAPSULE: 50 | 30 days supply | Qty: 60 | Fill #0

## 2016-02-29 MED FILL — BREO ELLIPTA 200-25 MCG INH: 200-25 | 30 days supply | Qty: 60 | Fill #6

## 2016-03-12 DIAGNOSIS — C61 Malignant neoplasm of prostate: Secondary | ICD-10-CM | POA: Diagnosis not present

## 2016-03-19 DIAGNOSIS — R3915 Urgency of urination: Secondary | ICD-10-CM | POA: Diagnosis not present

## 2016-03-19 DIAGNOSIS — C61 Malignant neoplasm of prostate: Secondary | ICD-10-CM | POA: Diagnosis not present

## 2016-04-02 MED FILL — FLUOROMETHOLONE 0.1% DROPS: 0.1 | 30 days supply | Qty: 5 | Fill #2

## 2016-04-02 MED FILL — BREO ELLIPTA 200-25 MCG INH: 200-25 | 30 days supply | Qty: 60 | Fill #7

## 2016-04-25 MED FILL — TELMISARTAN-HCTZ 80-12.5 MG: 80-12.5 | 90 days supply | Qty: 90 | Fill #3

## 2016-04-25 MED FILL — NASONEX 50 MCG NASAL SPRAY: 50 | 30 days supply | Qty: 17 | Fill #2

## 2016-04-25 MED FILL — ATORVASTATIN 40 MG TABLET: 40 | 90 days supply | Qty: 90 | Fill #2

## 2016-05-08 MED FILL — BREO ELLIPTA 200-25 MCG INH: 200-25 | 30 days supply | Qty: 60 | Fill #8

## 2016-05-21 DIAGNOSIS — I10 Essential (primary) hypertension: Secondary | ICD-10-CM | POA: Diagnosis not present

## 2016-05-21 DIAGNOSIS — E7800 Pure hypercholesterolemia, unspecified: Secondary | ICD-10-CM | POA: Diagnosis not present

## 2016-06-08 ENCOUNTER — Ambulatory Visit (INDEPENDENT_AMBULATORY_CARE_PROVIDER_SITE_OTHER): Payer: Medicare Other | Admitting: Internal Medicine

## 2016-06-08 ENCOUNTER — Encounter: Payer: Self-pay | Admitting: Internal Medicine

## 2016-06-08 DIAGNOSIS — J449 Chronic obstructive pulmonary disease, unspecified: Secondary | ICD-10-CM | POA: Diagnosis not present

## 2016-06-08 NOTE — Patient Instructions (Signed)
Glad you are doing wel  Ok to keep on Breo 200 1 puff then rinse mouth daily  Ok to use the rescue inhaler Ventolin, 2 puffs every 4-6 hours if needed  Your primary physician takes care of your flu and pneumonia vaccines  Please call if we can help

## 2016-06-08 NOTE — Assessment & Plan Note (Signed)
Memory Dance is working well. Did not like trial of Anoro previously. Rare need for rescue inhaler. Some dyspnea with exertion and sometimes wheeze in dusty environments. Now has somebody else do his yard and leaf work which had been a trigger. We don't record his flu and pneumonia vaccines here because he gets them with his primary physician at [facility]. No changes to offer at this visit.

## 2016-06-08 NOTE — Progress Notes (Signed)
Patient ID: Lucas Armstrong, male    DOB: 1946/08/12, 70 y.o.   MRN: [id number]  HPI M former smoker followed for COPD/asthma, allergic rhinitis.  07/08/15- 67 yoM former smoker followed for asthma/COPD, allergic rhinitis. FOLLOWS FOR: Continous wheezing this time of year. Denies any fever, chills, or congestion. Pt has to use rescue inhaler about 10 times a day during this time.  Typical exacerbation of asthma this time of year when he always blames on fall leaves. Wheezing and frequent use of metered inhaler. Qvar not holding him. Has had flu and pneumonia vaccine-PCP Office Spirometry 07/08/2015-severe obstructive airways disease with restricted vital capacity. FVC 2.33/55%, FEV1 1.36/41%, FEV1/FEC 0.58, FEF 25-75 percent 0.45/15%.  01/09/2016-70 year old male former smoker followed for COPD with asthma, allergic rhinitis FOLLOW FOR: Asthma, doing fine.  no concerns.  06/08/2016-70 year old male former smoker followed for COPD mixed type, allergic rhinitis FOLLOWS FOR: Pt denies any SOB, wheezing, cough, congestion. Will get flu shot in few weeks at PCP office.  Breo 200, Ventolin HFA He usually associates exacerbation in the fall with exposure to falling leaves and yard work. Much better controlled this year using  maintenance inhaler. Last used Ventolin rescue inhaler 3 or 4 weeks ago. Gets flu vaccine in a few weeks from his PCP who also manages his pneumonia vaccine. Walks regularly for exercise with no acute concerns. Denies cough or chest pain.  Review of Systems- HPI Constitutional:   No-   weight loss, night sweats, fevers, chills, fatigue, lassitude. HEENT:   No-  headaches, difficulty swallowing, tooth/dental problems, sore throat,       No- recent sneezing, itching, ear ache, +nasal congestion, post nasal drip,  CV:  No-   chest pain, orthopnea, PND, swelling in lower extremities, anasarca,   dizziness, palpitations Resp: + shortness of breath with exertion or at rest.             No-   productive cough,  No non-productive cough,  No- coughing up of blood.              No-   change in color of mucus. + wheezing.   Skin: No-   rash or lesions. GI:  No reflux GU:  MS:  No-   joint pain or swelling Neuro-     nothing unusual Psych:  No- change in mood or affect. No depression or anxiety.  No memory loss   Objective:   Physical Exam General- Alert, Oriented, Affect-appropriate, Distress- none acute, looks well Skin- rash-none, lesions- none, excoriation- none Lymphadenopathy- none Head- atraumatic            Eyes- Gross vision intact, PERRLA, conjunctivae-             Ears- + hard of hearing            Nose- Clear, no-Septal dev, mucus, polyps, erosion, perforation             Throat- Mallampati II , mucosa clear , drainage- none, tonsils- atrophic Neck- flexible , trachea midline, no stridor , thyroid nl, carotid no bruit Chest - symmetrical excursion , unlabored           Heart/CV- RRR , no murmur , no gallop  , no rub, nl s1 s2                           - JVD- none , edema- none, stasis changes- none, varices- none  Lung- + distant, wheeze- none, cough- none , dullness-none, rub- none           Chest wall-  Abd-  Br/ Gen/ Rectal- Not done, not indicated Extrem- cyanosis- none, clubbing, none, atrophy- none, strength- nl Neuro- grossly intact to observation

## 2016-06-18 MED FILL — BREO ELLIPTA 200-25 MCG INH: 200-25 | 30 days supply | Qty: 60 | Fill #9

## 2016-07-02 DIAGNOSIS — C6901 Malignant neoplasm of right conjunctiva: Secondary | ICD-10-CM | POA: Diagnosis not present

## 2016-07-02 DIAGNOSIS — Z961 Presence of intraocular lens: Secondary | ICD-10-CM | POA: Diagnosis not present

## 2016-07-02 DIAGNOSIS — H2512 Age-related nuclear cataract, left eye: Secondary | ICD-10-CM | POA: Diagnosis not present

## 2016-07-04 DIAGNOSIS — Z23 Encounter for immunization: Secondary | ICD-10-CM | POA: Diagnosis not present

## 2016-07-10 MED FILL — NASONEX 50 MCG NASAL SPRAY: 50 | 30 days supply | Qty: 17 | Fill #3

## 2016-07-10 MED FILL — FLUOROMETHOLONE 0.1% DROPS: 0.1 | 30 days supply | Qty: 5 | Fill #3

## 2016-07-23 ENCOUNTER — Other Ambulatory Visit: Payer: Self-pay | Admitting: Internal Medicine

## 2016-07-23 MED FILL — TELMISARTAN-HCTZ 80-12.5 MG: 80-12.5 | 90 days supply | Qty: 90 | Fill #0

## 2016-07-23 MED FILL — ATORVASTATIN 40 MG TABLET: 40 | 90 days supply | Qty: 90 | Fill #3

## 2016-07-24 MED FILL — BREO ELLIPTA 200-25 MCG INH: 200-25 | 30 days supply | Qty: 60 | Fill #0

## 2016-08-28 MED FILL — BREO ELLIPTA 200-25 MCG INH: 200-25 | 30 days supply | Qty: 60 | Fill #1

## 2016-10-02 MED FILL — BREO ELLIPTA 200-25 MCG INH: 200-25 | 30 days supply | Qty: 60 | Fill #2

## 2016-10-02 MED FILL — NASONEX 50 MCG NASAL SPRAY: 50 | 30 days supply | Qty: 17 | Fill #4

## 2016-10-24 MED FILL — VALSARTAN 80 MG TABLET: 80 | 30 days supply | Qty: 30 | Fill #0

## 2016-10-24 MED FILL — ATORVASTATIN 40 MG TABLET: 40 | 90 days supply | Qty: 90 | Fill #0

## 2016-10-30 MED FILL — FLUOROMETHOLONE 0.1% DROPS: 0.1 | 50 days supply | Qty: 5 | Fill #0

## 2016-11-05 DIAGNOSIS — Z961 Presence of intraocular lens: Secondary | ICD-10-CM | POA: Diagnosis not present

## 2016-11-05 DIAGNOSIS — C6901 Malignant neoplasm of right conjunctiva: Secondary | ICD-10-CM | POA: Diagnosis not present

## 2016-11-05 DIAGNOSIS — H2512 Age-related nuclear cataract, left eye: Secondary | ICD-10-CM | POA: Diagnosis not present

## 2016-11-05 MED FILL — BREO ELLIPTA 200-25 MCG INH: 200-25 | 30 days supply | Qty: 60 | Fill #3

## 2016-11-20 DIAGNOSIS — I10 Essential (primary) hypertension: Secondary | ICD-10-CM | POA: Diagnosis not present

## 2016-11-20 DIAGNOSIS — E7800 Pure hypercholesterolemia, unspecified: Secondary | ICD-10-CM | POA: Diagnosis not present

## 2016-11-20 MED FILL — VALSARTAN 160 MG TABLET: 160 | 30 days supply | Qty: 30 | Fill #0

## 2016-12-05 ENCOUNTER — Other Ambulatory Visit: Payer: Self-pay | Admitting: Internal Medicine

## 2016-12-05 MED FILL — BREO ELLIPTA 200-25 MCG INH: 200-25 | 30 days supply | Qty: 60 | Fill #4

## 2016-12-11 ENCOUNTER — Telehealth: Payer: Self-pay | Admitting: Internal Medicine

## 2016-12-11 NOTE — Telephone Encounter (Signed)
LMTCB- need to know the pharm

## 2016-12-12 NOTE — Telephone Encounter (Signed)
[facility] is who he uses.     Pt called back [phone]

## 2016-12-12 NOTE — Telephone Encounter (Signed)
atc pt X4, reached fast busy signal.  wcb.

## 2016-12-13 MED ORDER — NASONEX 50 MCG/ACT NA SUSP: NASAL | 3 refills | Status: DC

## 2016-12-13 NOTE — Telephone Encounter (Signed)
lmtcb for pt.   nasonex has been called in to preferred pharmacy.

## 2016-12-14 ENCOUNTER — Telehealth: Payer: Self-pay | Admitting: Internal Medicine

## 2016-12-14 NOTE — Telephone Encounter (Signed)
Spoke with Benjamine Mola at [place], states that nasonex is requiring a PA- faxing over PA request.  CY please advise if you'd like to initiate PA for nasonex or prescribe an alternative- per pharmacy no alternatives listed.  Thanks!

## 2016-12-14 NOTE — Telephone Encounter (Signed)
LMTCB for someone at the pharm to call back

## 2016-12-14 NOTE — Telephone Encounter (Signed)
Called pharmacy and they do offer nasocort otc.   lmtcb X1 for pt to make aware that he is able to take nasocort otc.

## 2016-12-14 NOTE — Telephone Encounter (Signed)
Insurance won't want to pay for Nasonex when there is an OTC equivalent-Flonase or Nasacort nasal sprays are available without prescription.

## 2016-12-17 NOTE — Telephone Encounter (Signed)
lmomtcb x 2  

## 2016-12-18 NOTE — Telephone Encounter (Signed)
lmomtcb x 3 for the pt to return our call

## 2016-12-19 MED FILL — NASONEX 50 MCG NASAL SPRAY: 50 | 30 days supply | Qty: 17 | Fill #0

## 2016-12-19 NOTE — Telephone Encounter (Signed)
lmomtcb x 4

## 2016-12-21 MED FILL — VALSARTAN-HCTZ 160-12.5 MG: 160-12.5 | 30 days supply | Qty: 30 | Fill #0

## 2016-12-26 MED FILL — KETOCONAZOLE 2% CREAM: 2 | 20 days supply | Qty: 60 | Fill #0

## 2017-01-15 MED FILL — BREO ELLIPTA 200-25 MCG INH: 200-25 | 30 days supply | Qty: 60 | Fill #5

## 2017-01-16 MED FILL — VALSARTAN-HCTZ 160-12.5 MG: 160-12.5 | 30 days supply | Qty: 30 | Fill #1

## 2017-02-18 ENCOUNTER — Telehealth: Payer: Self-pay | Admitting: Internal Medicine

## 2017-02-18 MED FILL — VALSARTAN-HCTZ 160-12.5 MG: 160-12.5 | 30 days supply | Qty: 30 | Fill #2

## 2017-02-18 MED FILL — FLUOROMETHOLONE 0.1% DROPS: 0.1 | 50 days supply | Qty: 5 | Fill #1

## 2017-02-18 MED FILL — ATORVASTATIN 40 MG TABLET: 40 | 90 days supply | Qty: 90 | Fill #1

## 2017-02-18 NOTE — Telephone Encounter (Signed)
lmom tcb x1 to pt  fyi to nurse: please see phone note from 12/14/16 about pt's nasonex

## 2017-02-18 NOTE — Telephone Encounter (Signed)
Patient is requesting a refill on Breo to be sent to [facility]...ert

## 2017-02-19 MED ORDER — MOMETASONE FUROATE 50 MCG/ACT NA SUSP: NASAL | 5 refills | Status: AC

## 2017-02-19 MED ORDER — FLUTICASONE FUROATE-VILANTEROL 200-25 MCG/INH IN AEPB: 1.0000 | INHALATION_SPRAY | Freq: Every day | RESPIRATORY_TRACT | 5 refills | Status: DC

## 2017-02-19 MED FILL — BREO ELLIPTA 200-25 MCG INH: 200-25 | 30 days supply | Qty: 60 | Fill #0

## 2017-02-19 NOTE — Telephone Encounter (Signed)
Pt retuerning call.Lucas Armstrong

## 2017-02-19 NOTE — Telephone Encounter (Signed)
Spoke with the pt  He wants breo and generic nasonex refilled  Rxs were sent  Nothing further needed

## 2017-02-19 NOTE — Telephone Encounter (Signed)
lmomtcb x 2 for the pt.  

## 2017-02-20 MED FILL — MOMETASONE FUROATE 50 MCG S: 50 | 30 days supply | Qty: 17 | Fill #0

## 2017-03-20 MED FILL — VALSARTAN-HCTZ 160-12.5 MG: 160-12.5 | 30 days supply | Qty: 30 | Fill #3

## 2017-03-25 DIAGNOSIS — R3915 Urgency of urination: Secondary | ICD-10-CM | POA: Diagnosis not present

## 2017-03-25 DIAGNOSIS — C61 Malignant neoplasm of prostate: Secondary | ICD-10-CM | POA: Diagnosis not present

## 2017-03-26 MED FILL — BREO ELLIPTA 200-25 MCG INH: 200-25 | 30 days supply | Qty: 60 | Fill #1

## 2017-04-08 DIAGNOSIS — C6901 Malignant neoplasm of right conjunctiva: Secondary | ICD-10-CM | POA: Diagnosis not present

## 2017-04-08 DIAGNOSIS — H2512 Age-related nuclear cataract, left eye: Secondary | ICD-10-CM | POA: Diagnosis not present

## 2017-04-08 DIAGNOSIS — Z961 Presence of intraocular lens: Secondary | ICD-10-CM | POA: Diagnosis not present

## 2017-04-08 MED FILL — OLOPATADINE HCL 0.2 % SOLN: 0.2 | 50 days supply | Qty: 5 | Fill #0

## 2017-04-08 MED FILL — PREDNISOLONE AC 1% EYE DROP: 1 | 25 days supply | Qty: 10 | Fill #0

## 2017-04-18 MED FILL — VALSARTAN-HCTZ 160-12.5 MG: 160-12.5 | 30 days supply | Qty: 30 | Fill #4

## 2017-04-18 MED FILL — MOMETASONE FUROATE 50 MCG S: 50 | 30 days supply | Qty: 17 | Fill #1

## 2017-04-30 MED FILL — BREO ELLIPTA 200-25 MCG INH: 200-25 | 30 days supply | Qty: 60 | Fill #2

## 2017-05-20 MED FILL — VALSARTAN-HCTZ 160-12.5 MG: 160-12.5 | 30 days supply | Qty: 30 | Fill #5

## 2017-05-23 DIAGNOSIS — I10 Essential (primary) hypertension: Secondary | ICD-10-CM | POA: Diagnosis not present

## 2017-05-23 DIAGNOSIS — E7800 Pure hypercholesterolemia, unspecified: Secondary | ICD-10-CM | POA: Diagnosis not present

## 2017-05-23 DIAGNOSIS — Z23 Encounter for immunization: Secondary | ICD-10-CM | POA: Diagnosis not present

## 2017-05-23 DIAGNOSIS — J449 Chronic obstructive pulmonary disease, unspecified: Secondary | ICD-10-CM | POA: Diagnosis not present

## 2017-05-28 MED FILL — ATORVASTATIN 40 MG TABLET: 40 | 90 days supply | Qty: 90 | Fill #2

## 2017-05-28 MED FILL — BREO ELLIPTA 200-25 MCG INH: 200-25 | 30 days supply | Qty: 60 | Fill #3

## 2017-06-10 ENCOUNTER — Other Ambulatory Visit (INDEPENDENT_AMBULATORY_CARE_PROVIDER_SITE_OTHER): Payer: Medicare Other

## 2017-06-10 ENCOUNTER — Ambulatory Visit (INDEPENDENT_AMBULATORY_CARE_PROVIDER_SITE_OTHER)
Admission: RE | Admit: 2017-06-10 | Discharge: 2017-06-10 | Disposition: A | Discharge status: Home / Self Care | Payer: Medicare Other | Source: Ambulatory Visit | Attending: Internal Medicine | Admitting: Internal Medicine

## 2017-06-10 ENCOUNTER — Encounter: Payer: Self-pay | Admitting: Internal Medicine

## 2017-06-10 ENCOUNTER — Ambulatory Visit (INDEPENDENT_AMBULATORY_CARE_PROVIDER_SITE_OTHER): Payer: Medicare Other | Admitting: Internal Medicine

## 2017-06-10 VITALS — BP 120/68 | HR 71 | Ht 68.0 in | Wt 158.4 lb

## 2017-06-10 DIAGNOSIS — J302 Other seasonal allergic rhinitis: Secondary | ICD-10-CM

## 2017-06-10 DIAGNOSIS — J449 Chronic obstructive pulmonary disease, unspecified: Secondary | ICD-10-CM | POA: Diagnosis not present

## 2017-06-10 DIAGNOSIS — J3089 Other allergic rhinitis: Secondary | ICD-10-CM

## 2017-06-10 DIAGNOSIS — J309 Allergic rhinitis, unspecified: Secondary | ICD-10-CM

## 2017-06-10 DIAGNOSIS — H1013 Acute atopic conjunctivitis, bilateral: Secondary | ICD-10-CM | POA: Diagnosis not present

## 2017-06-10 LAB — CBC WITH DIFFERENTIAL/PLATELET
BASOS PCT: 1.4 % (ref 0.0–3.0)
Basophils Absolute: 0.1 10*3/uL (ref 0.0–0.1)
EOS ABS: 0.7 10*3/uL (ref 0.0–0.7)
Eosinophils Relative: 9.9 % — ABNORMAL HIGH (ref 0.0–5.0)
HCT: 41.5 % (ref 39.0–52.0)
Hemoglobin: 14 g/dL (ref 13.0–17.0)
Lymphocytes Relative: 19.8 % (ref 12.0–46.0)
Lymphs Abs: 1.4 10*3/uL (ref 0.7–4.0)
MCHC: 33.8 g/dL (ref 30.0–36.0)
MCV: 101.2 fl — AB (ref 78.0–100.0)
MONOS PCT: 11.9 % (ref 3.0–12.0)
Monocytes Absolute: 0.9 10*3/uL (ref 0.1–1.0)
NEUTROS ABS: 4.1 10*3/uL (ref 1.4–7.7)
Neutrophils Relative %: 57 % (ref 43.0–77.0)
PLATELETS: 251 10*3/uL (ref 150.0–400.0)
RBC: 4.11 Mil/uL — ABNORMAL LOW (ref 4.22–5.81)
RDW: 12.7 % (ref 11.5–15.5)
WBC: 7.2 10*3/uL (ref 4.0–10.5)

## 2017-06-10 NOTE — Progress Notes (Signed)
Patient ID: Lucas Armstrong, male    DOB: April 15, 1946, 71 y.o.   MRN: [id number]  HPI M former smoker followed for COPD/asthma, allergic rhinitis. Complicated by HBP, prostate cancer PFT 01/04/14- Office Spirometry 07/08/2015-severe obstructive airways disease with restricted vital capacity. FVC 2.33/55%, FEV1 1.36/41%, FEV1/FEC 0.58, FEF 25-75 percent 0.45/15%  -----------------------------------------------------------------------------------------------------------  06/08/2016-71 year old male former smoker followed for COPD mixed type, allergic rhinitis, complicated by HBP,  prostate cancer FOLLOWS FOR: Pt denies any SOB, wheezing, cough, congestion. Will get flu shot in few weeks at PCP office.  Breo 200, Ventolin HFA He usually associates exacerbation in the fall with exposure to falling leaves and yard work. Much better controlled this year using  maintenance inhaler. Last used Ventolin rescue inhaler 3 or 4 weeks ago. Gets flu vaccine in a few weeks from his PCP who also manages his pneumonia vaccine. Walks regularly for exercise with no acute concerns. Denies cough or chest pain.  06/10/17- 71 year old male former smoker followed for COPD mixed type, allergic rhinitis, complicated by HBP, prostate cancer FOLLOWS FOR: Pt states he is doing well overall; denies any wheezing,cough, congestion, or SOB.  Breo 200, albuterol HFA He describes hearing loss but has not gotten a hearing aid. He feels breathing is well controlled. Very rare use of rescue inhaler but continues daily Breo 200. Spring and Summer has significant discomfort with itching and tearing of eyes he attributes to allergic conjunctivitis over the last 2 years. His eye doctor has given drops including Pataday and he also uses OTC. PCP uses vaccinations-has had pneumonia vaccine. No recent CXR. Not having significant cough. Not actively limited by dyspnea on exertion.  Review of Systems- HPI + = positive Constitutional:    No-   weight loss, night sweats, fevers, chills, fatigue, lassitude. HEENT:   No-  headaches, difficulty swallowing, tooth/dental problems, sore throat,       No- recent sneezing,+ itching, ear ache, +nasal congestion, post nasal drip,  CV:  No-   chest pain, orthopnea, PND, swelling in lower extremities, anasarca,   dizziness, palpitations Resp: + shortness of breath with exertion or at rest.              No-   productive cough,  No non-productive cough,  No- coughing up of blood.              No-   change in color of mucus. + rare wheezing.   Skin: No-   rash or lesions. GI:  No reflux GU:  MS:  No-   joint pain or swelling Neuro-     nothing unusual Psych:  No- change in mood or affect. No depression or anxiety.  No memory loss   Objective:   Physical Exam General- Alert, Oriented, Affect-appropriate, Distress- none acute, looks well Skin- rash-none, lesions- none, excoriation- none Lymphadenopathy- none Head- atraumatic            Eyes- Gross vision intact, PERRLA, conjunctivae- not injected            Ears- + hard of hearing            Nose- Clear, no-Septal dev, mucus, polyps, erosion, perforation             Throat- Mallampati II , mucosa clear , drainage- none, tonsils- atrophic Neck- flexible , trachea midline, no stridor , thyroid nl, carotid no bruit Chest - symmetrical excursion , unlabored           Heart/CV- RRR , no murmur ,  no gallop  , no rub, nl s1 s2                           - JVD- none , edema- none, stasis changes- none, varices- none           Lung- + distant, wheeze- none, cough- none , dullness-none, rub- none           Chest wall-  Abd-  Br/ Gen/ Rectal- Not done, not indicated Extrem- cyanosis- none, clubbing, none, atrophy- none, strength- nl Neuro- grossly intact to observation

## 2017-06-10 NOTE — Assessment & Plan Note (Signed)
He doesn't notice any real change and seems well controlled on Breo. Patient's pneumonia and flu vaccines at his primary office and we will try to get that history for our documentation. Plan-CXR because of his smoking history and COPD

## 2017-06-10 NOTE — Patient Instructions (Signed)
I will ask my staff to call Dr Marlou Sa Memon's office to get a list of your flu and pneumonia vaccine history for our recordss.  Order- CXR     Dx COPD mixed type  Order- Allergy profile, CBC w diff     Dx Seasonal and perennial allergic conjunctivitis  Please call if we can help

## 2017-06-10 NOTE — Assessment & Plan Note (Signed)
He has had a lot of visits to his eye doctor documented, mostly for other issues. At his age, vasomotor/nonallergic rhinitis and dry eye are expected to be more common than atopic problems. Plan-labs for allergy profile and CBC with differential for eosinophil count

## 2017-06-11 LAB — RESPIRATORY ALLERGY PROFILE REGION II ~~LOC~~
ALLERGEN, CEDAR TREE, T6: 0.16 kU/L — AB
ALLERGEN, D PTERNOYSSINUS, D1: 93.7 kU/L — AB
ALLERGEN, OAK, T7: 5.53 kU/L — AB
Allergen, A. alternata, m6: 0.69 kU/L — ABNORMAL HIGH
Allergen, Comm Silver Birch, t9: 4.1 kU/L — ABNORMAL HIGH
Allergen, Cottonwood, t14: <0.1 kU/L
Allergen, Mouse Urine Protein, e78: 0.2 kU/L — ABNORMAL HIGH
Allergen, P. notatum, m1: 0.48 kU/L — ABNORMAL HIGH
Aspergillus fumigatus, m3: 0.13 kU/L — ABNORMAL HIGH
Bermuda Grass: 1.52 kU/L — ABNORMAL HIGH
Box Elder IgE: 0.54 kU/L — ABNORMAL HIGH
CLADOSPORIUM HERBARUM (M2) IGE: <0.1 kU/L
CLASS: 0
CLASS: 0
CLASS: 0
CLASS: 0
CLASS: 5
COMMON RAGWEED (SHORT) (W1) IGE: 1.47 kU/L — ABNORMAL HIGH
Cat Dander: 1.48 kU/L — ABNORMAL HIGH
Class: 0
Class: 0
Class: 0
Class: 0
Class: 0
Class: 0
Class: 1
Class: 1
Class: 1
Class: 2
Class: 2
Class: 2
Class: 2
Class: 3
Class: 3
Class: 3
Class: 3
Class: 5
Class: 5
Cockroach: <0.1 kU/L
D. farinae: 99.1 kU/L — ABNORMAL HIGH
Dog Dander: 65 kU/L — ABNORMAL HIGH
Elm IgE: 0.16 kU/L — ABNORMAL HIGH
IgE (Immunoglobulin E), Serum: 1083 kU/L — ABNORMAL HIGH (ref <=114)
Johnson Grass: 1.02 kU/L — ABNORMAL HIGH
Pecan/Hickory Tree IgE: 4.03 kU/L — ABNORMAL HIGH
Sheep Sorrel IgE: 0.1 kU/L — ABNORMAL HIGH
Timothy Grass: 4.47 kU/L — ABNORMAL HIGH

## 2017-06-11 LAB — INTERPRETATION:

## 2017-06-12 ENCOUNTER — Telehealth: Payer: Self-pay | Admitting: Internal Medicine

## 2017-06-12 NOTE — Telephone Encounter (Signed)
lmtcb for pt.  

## 2017-06-12 NOTE — Progress Notes (Signed)
LMTCB

## 2017-06-12 NOTE — Telephone Encounter (Signed)
Notes recorded by Deneise Lever, MD on 06/10/2017 at 1:07 PM EDT CXR- changes of COPD with now new or active process seen.   I would recommend we check fo the gene associated with increased risk for emphysema. Sorry I didn't order this while he was here.  Please order alpha-1- antitrypsin assay   Dx COPD mixed type  LMTCB

## 2017-06-12 NOTE — Telephone Encounter (Signed)
Patient returning call - he can be reached at [phone] -pr

## 2017-06-12 NOTE — Telephone Encounter (Signed)
atc pt X3, line rang to fast busy signal.  Wcb.  

## 2017-06-12 NOTE — Telephone Encounter (Signed)
Patient is returning phone call. [phone]

## 2017-06-13 NOTE — Telephone Encounter (Signed)
lmtcb x2 for pt. 

## 2017-06-14 NOTE — Telephone Encounter (Signed)
lmomtcb x 3 for the pt.   

## 2017-06-17 MED FILL — MOMETASONE FUROATE 50 MCG S: 50 | 30 days supply | Qty: 17 | Fill #2

## 2017-06-17 MED FILL — VALSARTAN-HCTZ 160-12.5 MG: 160-12.5 | 30 days supply | Qty: 30 | Fill #6

## 2017-06-17 NOTE — Telephone Encounter (Signed)
Results of allergy labs released.

## 2017-06-17 NOTE — Telephone Encounter (Signed)
Pt returned phone call, was out of town, pt contact # [phone]

## 2017-06-17 NOTE — Telephone Encounter (Signed)
Pt is aware of results and voiced his understanding. Pt states he would like to discuss this further at his next OV with CY. Pending OV 06/10/18. Pt is requesting lab results from 06/10/17.  CY please advise. Thanks.

## 2017-06-17 NOTE — Telephone Encounter (Signed)
Notes recorded by Deneise Lever, MD on 06/17/2017 at 10:33 AM EDT Allergy labs - allergy antibody levels are elevated for many common environmental allergens- pollens, animal danders, house dust mites and some molds. As long as symptoms are under pretty good control, we don't need to change what we are doing.  Pt is aware of results and voiced his understanding. Nothing further needed.

## 2017-06-17 NOTE — Telephone Encounter (Signed)
ATC pt, no answer. Left message for pt to call back.  

## 2017-06-18 DIAGNOSIS — Z23 Encounter for immunization: Secondary | ICD-10-CM | POA: Diagnosis not present

## 2017-06-21 NOTE — Progress Notes (Signed)
lmtcb for pt.  

## 2017-06-25 ENCOUNTER — Other Ambulatory Visit: Payer: Self-pay | Admitting: Internal Medicine

## 2017-06-25 MED FILL — FLUOROMETHOLONE 0.1% DROPS: 0.1 | 50 days supply | Qty: 5 | Fill #2

## 2017-06-25 MED FILL — VENTOLIN HFA 90 MCG INHALER: 108 (90 BAS | 16 days supply | Qty: 18 | Fill #0

## 2017-06-27 DIAGNOSIS — H2512 Age-related nuclear cataract, left eye: Secondary | ICD-10-CM | POA: Diagnosis not present

## 2017-07-01 DIAGNOSIS — L309 Dermatitis, unspecified: Secondary | ICD-10-CM | POA: Diagnosis not present

## 2017-07-01 DIAGNOSIS — L570 Actinic keratosis: Secondary | ICD-10-CM | POA: Diagnosis not present

## 2017-07-04 MED FILL — BREO ELLIPTA 200-25 MCG INH: 200-25 | 30 days supply | Qty: 60 | Fill #4

## 2017-07-09 MED FILL — DESONIDE 0.05% CREAM: 0.05 | 7 days supply | Qty: 30 | Fill #0

## 2017-07-11 DIAGNOSIS — Z79899 Other long term (current) drug therapy: Secondary | ICD-10-CM | POA: Diagnosis not present

## 2017-07-11 DIAGNOSIS — Z961 Presence of intraocular lens: Secondary | ICD-10-CM | POA: Diagnosis not present

## 2017-07-11 DIAGNOSIS — H52202 Unspecified astigmatism, left eye: Secondary | ICD-10-CM | POA: Diagnosis not present

## 2017-07-11 DIAGNOSIS — J45998 Other asthma: Secondary | ICD-10-CM | POA: Diagnosis not present

## 2017-07-11 DIAGNOSIS — J45909 Unspecified asthma, uncomplicated: Secondary | ICD-10-CM | POA: Diagnosis not present

## 2017-07-11 DIAGNOSIS — I10 Essential (primary) hypertension: Secondary | ICD-10-CM | POA: Diagnosis not present

## 2017-07-11 DIAGNOSIS — Z9841 Cataract extraction status, right eye: Secondary | ICD-10-CM | POA: Diagnosis not present

## 2017-07-11 DIAGNOSIS — H25812 Combined forms of age-related cataract, left eye: Secondary | ICD-10-CM | POA: Diagnosis not present

## 2017-07-11 DIAGNOSIS — E785 Hyperlipidemia, unspecified: Secondary | ICD-10-CM | POA: Diagnosis not present

## 2017-07-11 DIAGNOSIS — Z83518 Family history of other specified eye disorder: Secondary | ICD-10-CM | POA: Diagnosis not present

## 2017-07-18 MED FILL — VALSARTAN-HCTZ 160-12.5 MG: 160-12.5 | 30 days supply | Qty: 30 | Fill #7

## 2017-08-05 MED FILL — BREO ELLIPTA 200-25 MCG INH: 200-25 | 30 days supply | Qty: 60 | Fill #5

## 2017-08-13 MED FILL — OLOPATADINE HCL 0.2 % SOLN: 0.2 | 50 days supply | Qty: 5 | Fill #1

## 2017-08-19 MED FILL — VALSARTAN-HCTZ 160-12.5 MG: 160-12.5 | 30 days supply | Qty: 30 | Fill #8

## 2017-08-19 MED FILL — MOMETASONE FUROATE 50 MCG S: 50 | 30 days supply | Qty: 17 | Fill #3

## 2017-09-11 ENCOUNTER — Other Ambulatory Visit: Payer: Self-pay | Admitting: Internal Medicine

## 2017-09-11 MED FILL — VALSARTAN-HCTZ 160-12.5 MG: 160-12.5 | 30 days supply | Qty: 30 | Fill #9

## 2017-09-11 MED FILL — BREO ELLIPTA 200-25 MCG INH: 200-25 | 30 days supply | Qty: 60 | Fill #0

## 2017-09-11 MED FILL — ATORVASTATIN 40 MG TABLET: 40 | 90 days supply | Qty: 90 | Fill #3

## 2017-10-16 MED FILL — BREO ELLIPTA 200-25 MCG INH: 200-25 | 30 days supply | Qty: 60 | Fill #1

## 2017-10-16 MED FILL — VALSARTAN-HCTZ 160-12.5 MG: 160-12.5 | 30 days supply | Qty: 30 | Fill #10

## 2017-10-28 MED FILL — DESONIDE 0.05% CREAM: 0.05 | 7 days supply | Qty: 30 | Fill #1

## 2017-10-28 MED FILL — MOMETASONE FUROATE 50 MCG S: 50 | 30 days supply | Qty: 17 | Fill #4

## 2017-11-13 MED FILL — VALSARTAN-HCTZ 160-12.5 MG: 160-12.5 | 30 days supply | Qty: 30 | Fill #11

## 2017-11-19 MED FILL — BREO ELLIPTA 200-25 MCG INH: 200-25 | 30 days supply | Qty: 60 | Fill #2

## 2017-11-28 DIAGNOSIS — E7800 Pure hypercholesterolemia, unspecified: Secondary | ICD-10-CM | POA: Diagnosis not present

## 2017-11-28 DIAGNOSIS — I10 Essential (primary) hypertension: Secondary | ICD-10-CM | POA: Diagnosis not present

## 2017-11-28 DIAGNOSIS — J449 Chronic obstructive pulmonary disease, unspecified: Secondary | ICD-10-CM | POA: Diagnosis not present

## 2017-12-05 MED FILL — VALSARTAN-HCTZ 160-12.5 MG: 160-12.5 | 90 days supply | Qty: 90 | Fill #0

## 2017-12-11 DIAGNOSIS — Z9841 Cataract extraction status, right eye: Secondary | ICD-10-CM | POA: Diagnosis not present

## 2017-12-11 DIAGNOSIS — Z961 Presence of intraocular lens: Secondary | ICD-10-CM | POA: Diagnosis not present

## 2017-12-11 DIAGNOSIS — Z9842 Cataract extraction status, left eye: Secondary | ICD-10-CM | POA: Diagnosis not present

## 2017-12-20 ENCOUNTER — Telehealth: Payer: Self-pay | Admitting: Internal Medicine

## 2017-12-20 MED FILL — ATORVASTATIN 40 MG TABLET: 40 | 90 days supply | Qty: 90 | Fill #0

## 2017-12-20 NOTE — Telephone Encounter (Signed)
Attempted to call pt but no answer.   Left message for pt to return our call x1 

## 2017-12-23 MED FILL — BREO ELLIPTA 200-25 MCG INH: 200-25 | 30 days supply | Qty: 60 | Fill #3

## 2017-12-23 NOTE — Telephone Encounter (Signed)
Attempted to contact pt. I did not receive an answer. There was no option for me to leave a message. Will try back.  

## 2017-12-23 NOTE — Telephone Encounter (Signed)
Spoke with pt. States that his insurance is not covering Nasonex. After calling us the pt has decided that he is just going to try some OTC nasal sprays in place of the Nasonex. Advised pt to call us if he feels like the OTC nasal sprays aren't helping. He agreed and verbalized understanding. Nothing further was needed.

## 2018-01-24 MED FILL — BREO ELLIPTA 200-25 MCG INH: 200-25 | 30 days supply | Qty: 60 | Fill #4

## 2018-02-25 MED FILL — BREO ELLIPTA 200-25 MCG INH: 200-25 | 30 days supply | Qty: 60 | Fill #5

## 2018-03-12 MED FILL — VALSARTAN-HCTZ 160-12.5 MG: 160-12.5 | 90 days supply | Qty: 90 | Fill #1

## 2018-03-25 MED FILL — ATORVASTATIN 40 MG TABLET: 40 | 90 days supply | Qty: 90 | Fill #1

## 2018-04-01 ENCOUNTER — Other Ambulatory Visit: Payer: Self-pay | Admitting: Internal Medicine

## 2018-04-01 MED FILL — BREO ELLIPTA 200-25 MCG INH: 200-25 | 30 days supply | Qty: 60 | Fill #0

## 2018-04-04 DIAGNOSIS — Z8546 Personal history of malignant neoplasm of prostate: Secondary | ICD-10-CM | POA: Diagnosis not present

## 2018-04-04 DIAGNOSIS — R3915 Urgency of urination: Secondary | ICD-10-CM | POA: Diagnosis not present

## 2018-04-25 MED FILL — FLUOROMETHOLONE 0.1% DROPS: 0.1 | 50 days supply | Qty: 5 | Fill #0

## 2018-05-06 MED FILL — BREO ELLIPTA 200-25 MCG INH: 200-25 | 30 days supply | Qty: 60 | Fill #1

## 2018-05-06 MED FILL — DESONIDE 0.05% CREAM: 0.05 | 7 days supply | Qty: 30 | Fill #2

## 2018-06-05 DIAGNOSIS — I10 Essential (primary) hypertension: Secondary | ICD-10-CM | POA: Diagnosis not present

## 2018-06-05 DIAGNOSIS — E7800 Pure hypercholesterolemia, unspecified: Secondary | ICD-10-CM | POA: Diagnosis not present

## 2018-06-05 DIAGNOSIS — J449 Chronic obstructive pulmonary disease, unspecified: Secondary | ICD-10-CM | POA: Diagnosis not present

## 2018-06-05 DIAGNOSIS — Z1159 Encounter for screening for other viral diseases: Secondary | ICD-10-CM | POA: Diagnosis not present

## 2018-06-06 MED FILL — BREO ELLIPTA 200-25 MCG INH: 200-25 | 30 days supply | Qty: 60 | Fill #2

## 2018-06-10 ENCOUNTER — Ambulatory Visit: Payer: Medicare Other | Admitting: Internal Medicine

## 2018-06-10 ENCOUNTER — Ambulatory Visit (INDEPENDENT_AMBULATORY_CARE_PROVIDER_SITE_OTHER)
Admission: RE | Admit: 2018-06-10 | Discharge: 2018-06-10 | Disposition: A | Discharge status: Home / Self Care | Payer: Medicare Other | Source: Ambulatory Visit | Attending: Internal Medicine | Admitting: Internal Medicine

## 2018-06-10 ENCOUNTER — Encounter: Payer: Self-pay | Admitting: Internal Medicine

## 2018-06-10 VITALS — BP 134/72 | HR 78 | Ht 68.0 in | Wt 157.4 lb

## 2018-06-10 DIAGNOSIS — H1013 Acute atopic conjunctivitis, bilateral: Secondary | ICD-10-CM

## 2018-06-10 DIAGNOSIS — J309 Allergic rhinitis, unspecified: Secondary | ICD-10-CM | POA: Diagnosis not present

## 2018-06-10 DIAGNOSIS — J45909 Unspecified asthma, uncomplicated: Secondary | ICD-10-CM | POA: Diagnosis not present

## 2018-06-10 DIAGNOSIS — J449 Chronic obstructive pulmonary disease, unspecified: Secondary | ICD-10-CM | POA: Diagnosis not present

## 2018-06-10 NOTE — Assessment & Plan Note (Signed)
Seasonal variation.  He manages with Flonase and Claritin if needed.

## 2018-06-10 NOTE — Progress Notes (Signed)
Patient ID: Lucas Armstrong, male    DOB: 1946-04-05, 72 y.o.   MRN: [id number]  HPI M former smoker followed for COPD/asthma, allergic rhinitis. Complicated by HBP, prostate cancer PFT 01/04/14- Office Spirometry 07/08/2015-severe obstructive airways disease with restricted vital capacity. FVC 2.33/55%, FEV1 1.36/41%, FEV1/FEC 0.58, FEF 25-75 percent 0.45/15%  -----------------------------------------------------------------------------------------------------------.  06/10/17- 72 year old male former smoker followed for COPD mixed type, allergic rhinitis, complicated by HBP, prostate cancer FOLLOWS FOR: Pt states he is doing well overall; denies any wheezing,cough, congestion, or SOB.  Breo 200, albuterol HFA He describes hearing loss but has not gotten a hearing aid. He feels breathing is well controlled. Very rare use of rescue inhaler but continues daily Breo 200. Spring and Summer has significant discomfort with itching and tearing of eyes he attributes to allergic conjunctivitis over the last 2 years. His eye doctor has given drops including Pataday and he also uses OTC. PCP uses vaccinations-has had pneumonia vaccine. No recent CXR. Not having significant cough. Not actively limited by dyspnea on exertion.  06/10/2018- 72 year old male former smoker followed for COPD mixed type, allergic rhinitis, complicated by HBP, prostate cancer Annual follow-up Ventolin HFA, Nasonex, Breo 200 Stable with "less asthma" through the summer.  Someone else does his yard work now which has made a big difference.  No recent prednisone.  Getting flu shot from his PCP in 2 weeks.  He is very comfortable with his medications, using rescue inhaler only every few weeks during mid summer and now about once a month. CXR 06/10/2017- COPD without acute abnormality.  Review of Systems- HPI + = positive Constitutional:   No-   weight loss, night sweats, fevers, chills, fatigue, lassitude. HEENT:   No-   headaches, difficulty swallowing, tooth/dental problems, sore throat,       No- recent sneezing,+ itching, ear ache, +nasal congestion, post nasal drip,  CV:  No-   chest pain, orthopnea, PND, swelling in lower extremities, anasarca,   dizziness, palpitations Resp: + shortness of breath with exertion or at rest.              No-   productive cough,  No non-productive cough,  No- coughing up of blood.              No-   change in color of mucus. + rare wheezing.   Skin: No-   rash or lesions. GI:  No reflux GU:  MS:  No-   joint pain or swelling Neuro-     nothing unusual Psych:  No- change in mood or affect. No depression or anxiety.  No memory loss   Objective:   Physical Exam General- Alert, Oriented, Affect-appropriate, Distress- none acute, looks well Skin- rash-none, lesions- none, excoriation- none Lymphadenopathy- none Head- atraumatic            Eyes- Gross vision intact, PERRLA, conjunctivae- not injected            Ears- + hard of hearing            Nose- Clear, no-Septal dev, mucus, polyps, erosion, perforation             Throat- Mallampati II , mucosa clear , drainage- none, tonsils- atrophic Neck- flexible , trachea midline, no stridor , thyroid nl, carotid no bruit Chest - symmetrical excursion , unlabored           Heart/CV- RRR , no murmur , no gallop  , no rub, nl s1 s2                           -  JVD- none , edema- none, stasis changes- none, varices- none           Lung- + distant, wheeze- none, cough- none , dullness-none, rub- none           Chest wall-  Abd-  Br/ Gen/ Rectal- Not done, not indicated Extrem- cyanosis- none, clubbing, none, atrophy- none, strength- nl Neuro- grossly intact to observation

## 2018-06-10 NOTE — Patient Instructions (Signed)
We can continue Breo and the albuterol rescue inhaler. Please let us know if you need refills  Order- CXR    Dx COPD mixed type  You can ask Dr Alroy Dust if you are due for any more pneumonia vaccine  Please call if we can help

## 2018-06-10 NOTE — Assessment & Plan Note (Signed)
Stable with good control on current meds.  Pattern is more consistent with COPD now rather than asthma. Plan-update CXR

## 2018-06-11 MED FILL — VALSARTAN-HCTZ 160-12.5 MG: 160-12.5 | 90 days supply | Qty: 90 | Fill #2

## 2018-06-17 ENCOUNTER — Telehealth: Payer: Self-pay | Admitting: Internal Medicine

## 2018-06-17 NOTE — Telephone Encounter (Signed)
Called and spoke to patient, made aware of CXR results. Voiced understanding. Nothing further is needed at this time.

## 2018-06-27 DIAGNOSIS — Z23 Encounter for immunization: Secondary | ICD-10-CM | POA: Diagnosis not present

## 2018-06-27 DIAGNOSIS — I10 Essential (primary) hypertension: Secondary | ICD-10-CM | POA: Diagnosis not present

## 2018-07-02 MED FILL — ATORVASTATIN 40 MG TABLET: 40 | 90 days supply | Qty: 90 | Fill #0

## 2018-07-08 MED FILL — BREO ELLIPTA 200-25 MCG INH: 200-25 | 30 days supply | Qty: 60 | Fill #3

## 2018-07-16 DIAGNOSIS — Z961 Presence of intraocular lens: Secondary | ICD-10-CM | POA: Diagnosis not present

## 2018-07-16 DIAGNOSIS — Z9841 Cataract extraction status, right eye: Secondary | ICD-10-CM | POA: Diagnosis not present

## 2018-07-16 DIAGNOSIS — Z9842 Cataract extraction status, left eye: Secondary | ICD-10-CM | POA: Diagnosis not present

## 2018-08-13 MED FILL — BREO ELLIPTA 200-25 MCG INH: 200-25 | 30 days supply | Qty: 60 | Fill #4

## 2018-08-19 DIAGNOSIS — J069 Acute upper respiratory infection, unspecified: Secondary | ICD-10-CM | POA: Diagnosis not present

## 2018-08-19 MED FILL — PROMETHAZINE W/COD SYRUP: 6.25-10 | 4 days supply | Qty: 120 | Fill #0

## 2018-08-21 MED FILL — FLUOROMETHOLONE 0.1% DROPS: 0.1 | 50 days supply | Qty: 5 | Fill #1

## 2018-09-08 ENCOUNTER — Telehealth: Payer: Self-pay | Admitting: Internal Medicine

## 2018-09-08 ENCOUNTER — Encounter: Payer: Self-pay | Admitting: Internal Medicine

## 2018-09-08 MED ORDER — ALBUTEROL SULFATE HFA 108 (90 BASE) MCG/ACT IN AERS: INHALATION_SPRAY | RESPIRATORY_TRACT | 12 refills | Status: DC

## 2018-09-08 MED FILL — VENTOLIN HFA 90 MCG INHALER: 108 (90 BAS | 25 days supply | Qty: 18 | Fill #0

## 2018-09-08 MED FILL — VALSARTAN-HCTZ 160-12.5 MG: 160-12.5 | 90 days supply | Qty: 90 | Fill #3

## 2018-09-08 NOTE — Telephone Encounter (Signed)
Patient came in requesting refill of albuterol he can be reached at [phone]

## 2018-09-08 NOTE — Telephone Encounter (Signed)
Called and spoke with patient, he stated that he is needing a refill of his albuterol and would like for it to be sent to [facility] outpatient pharmacy. Refill has been sent. Nothing further needed.

## 2018-09-23 MED FILL — BREO ELLIPTA 200-25 MCG INH: 200-25 | 30 days supply | Qty: 60 | Fill #5

## 2018-10-07 MED FILL — ATORVASTATIN 40 MG TABLET: 40 | 90 days supply | Qty: 90 | Fill #1

## 2018-10-28 ENCOUNTER — Other Ambulatory Visit: Payer: Self-pay | Admitting: Internal Medicine

## 2018-10-28 MED FILL — BREO ELLIPTA 200-25 MCG INH: 200-25 | 30 days supply | Qty: 60 | Fill #0

## 2018-11-27 MED FILL — BREO ELLIPTA 200-25 MCG INH: 200-25 | 30 days supply | Qty: 60 | Fill #1 | Status: TO

## 2018-11-27 MED FILL — DESONIDE 0.05% CREAM: 0.05 | 14 days supply | Qty: 30 | Fill #0

## 2018-12-09 MED FILL — VALSARTAN-HCTZ 160-12.5 MG: 160-12.5 | 30 days supply | Qty: 30 | Fill #0

## 2018-12-11 DIAGNOSIS — J449 Chronic obstructive pulmonary disease, unspecified: Secondary | ICD-10-CM | POA: Diagnosis not present

## 2018-12-11 DIAGNOSIS — I10 Essential (primary) hypertension: Secondary | ICD-10-CM | POA: Diagnosis not present

## 2018-12-11 DIAGNOSIS — E7800 Pure hypercholesterolemia, unspecified: Secondary | ICD-10-CM | POA: Diagnosis not present

## 2018-12-29 MED FILL — BREO ELLIPTA 200-25 MCG INH: 200-25 | 30 days supply | Qty: 60 | Fill #0

## 2019-01-05 MED FILL — ATORVASTATIN 40 MG TABLET: 40 | 90 days supply | Qty: 90 | Fill #0

## 2019-01-05 MED FILL — VALSARTAN-HCTZ 160-12.5 MG: 160-12.5 | 30 days supply | Qty: 30 | Fill #1

## 2019-01-14 DIAGNOSIS — C6901 Malignant neoplasm of right conjunctiva: Secondary | ICD-10-CM | POA: Diagnosis not present

## 2019-01-14 DIAGNOSIS — Z961 Presence of intraocular lens: Secondary | ICD-10-CM | POA: Diagnosis not present

## 2019-01-28 MED FILL — BREO ELLIPTA 200-25 MCG INH: 200-25 | 30 days supply | Qty: 60 | Fill #1

## 2019-02-09 MED FILL — VALSARTAN-HCTZ 160-12.5 MG: 160-12.5 | 30 days supply | Qty: 30 | Fill #2

## 2019-03-03 MED FILL — BREO ELLIPTA 200-25 MCG INH: 200-25 | 30 days supply | Qty: 60 | Fill #2

## 2019-03-10 MED FILL — VALSARTAN-HCTZ 160-12.5 MG: 160-12.5 | 30 days supply | Qty: 30 | Fill #0

## 2019-03-30 IMAGING — DX DG CHEST 2V
2 series · 2 of 2 positions shown · non-contrast
Comparison: Chest x-ray of 06/10/2017

CLINICAL DATA: History of COPD and asthma, former smoking history

EXAM:
CHEST - 2 VIEW

[chest pa]
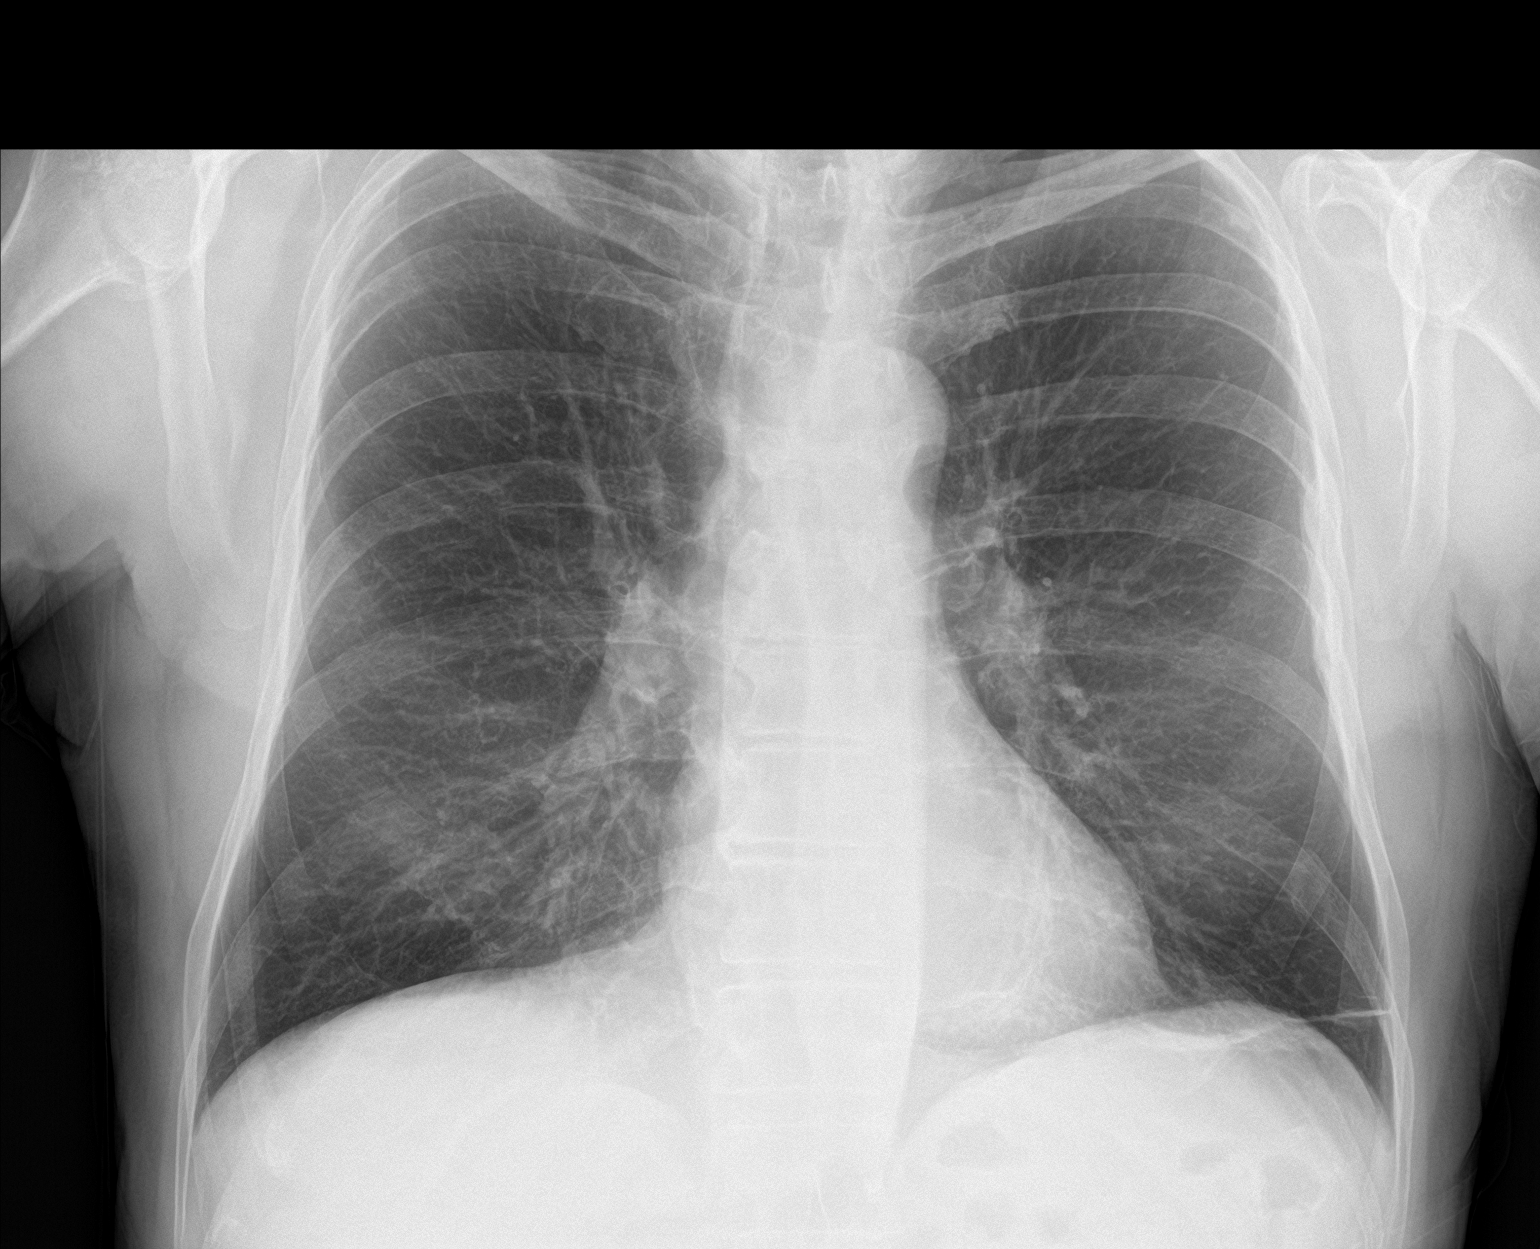

[chest lat]
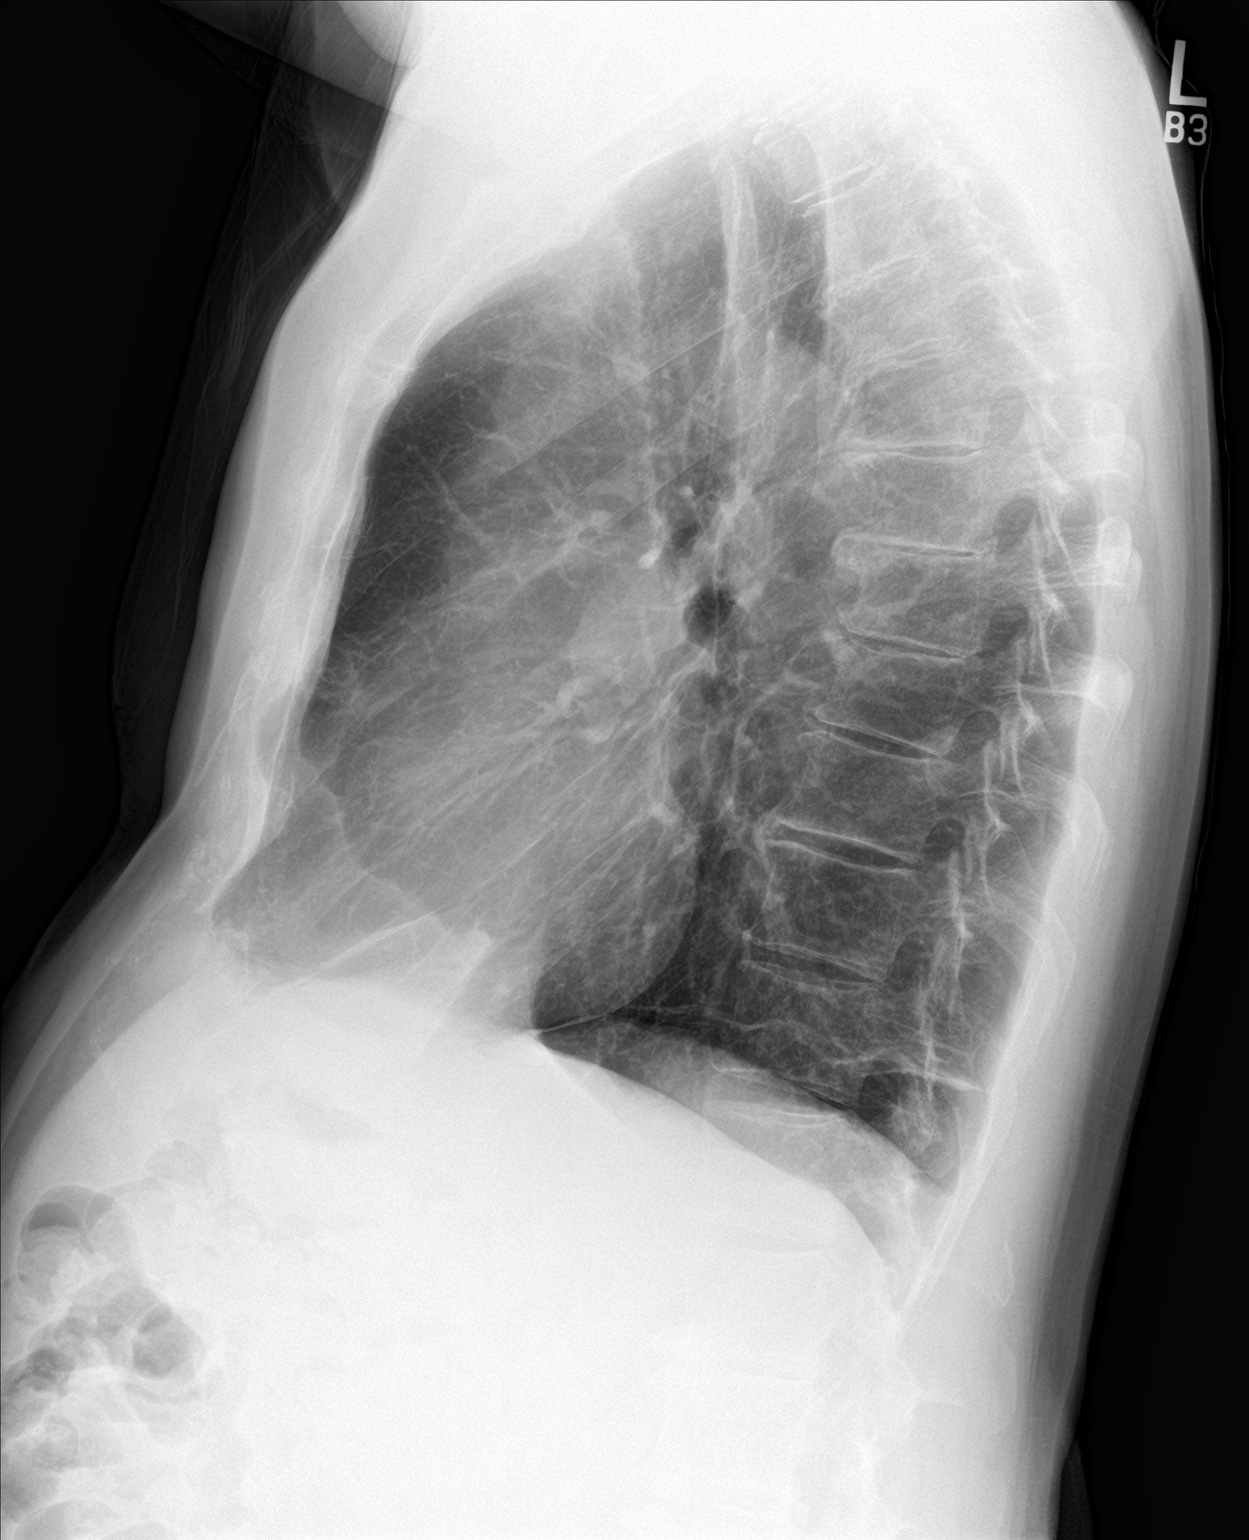

[2 of 2 positions shown; findings below may reference images not displayed]

FINDINGS: The lungs remain somewhat hyperaerated with slightly flattened
hemidiaphragms suggesting emphysema. Biapical pleural thickening is
stable. Linear scarring or atelectasis is now noted at the left lung
base. No pneumonia or effusion is seen. Mediastinal and hilar
contours are unremarkable and heart size is stable. There are
degenerative changes in the mid to lower thoracic spine.
IMPRESSION: Slight hyper aeration. No active lung disease. Linear scarring or
atelectasis is noted at the left lung base.

## 2019-04-06 DIAGNOSIS — R3915 Urgency of urination: Secondary | ICD-10-CM | POA: Diagnosis not present

## 2019-04-06 DIAGNOSIS — Z8546 Personal history of malignant neoplasm of prostate: Secondary | ICD-10-CM | POA: Diagnosis not present

## 2019-04-06 MED FILL — BREO ELLIPTA 200-25 MCG INH: 200-25 | 30 days supply | Qty: 60 | Fill #3

## 2019-04-09 MED FILL — VALSARTAN-HCTZ 160-12.5 MG: 160-12.5 | 30 days supply | Qty: 30 | Fill #1

## 2019-04-13 MED FILL — ATORVASTATIN 40 MG TABLET: 40 | 90 days supply | Qty: 90 | Fill #1

## 2019-05-06 ENCOUNTER — Other Ambulatory Visit: Payer: Self-pay | Admitting: Internal Medicine

## 2019-05-06 MED FILL — BREO ELLIPTA 200-25 MCG INH: 200-25 | 30 days supply | Qty: 60 | Fill #0

## 2019-05-11 MED FILL — VALSARTAN-HCTZ 160-12.5 MG: 160-12.5 | 30 days supply | Qty: 30 | Fill #2

## 2019-05-12 DIAGNOSIS — L409 Psoriasis, unspecified: Secondary | ICD-10-CM | POA: Diagnosis not present

## 2019-05-12 DIAGNOSIS — D229 Melanocytic nevi, unspecified: Secondary | ICD-10-CM | POA: Diagnosis not present

## 2019-05-12 DIAGNOSIS — C44619 Basal cell carcinoma of skin of left upper limb, including shoulder: Secondary | ICD-10-CM | POA: Diagnosis not present

## 2019-05-12 DIAGNOSIS — C44719 Basal cell carcinoma of skin of left lower limb, including hip: Secondary | ICD-10-CM | POA: Diagnosis not present

## 2019-05-12 DIAGNOSIS — C4491 Basal cell carcinoma of skin, unspecified: Secondary | ICD-10-CM

## 2019-05-12 HISTORY — DX: Basal cell carcinoma of skin, unspecified: C44.91

## 2019-06-04 DIAGNOSIS — C44619 Basal cell carcinoma of skin of left upper limb, including shoulder: Secondary | ICD-10-CM | POA: Diagnosis not present

## 2019-06-11 ENCOUNTER — Ambulatory Visit: Payer: Medicare Other | Admitting: Internal Medicine

## 2019-06-11 ENCOUNTER — Other Ambulatory Visit: Payer: Self-pay

## 2019-06-11 ENCOUNTER — Encounter: Payer: Self-pay | Admitting: Internal Medicine

## 2019-06-11 DIAGNOSIS — H9190 Unspecified hearing loss, unspecified ear: Secondary | ICD-10-CM | POA: Insufficient documentation

## 2019-06-11 DIAGNOSIS — H1013 Acute atopic conjunctivitis, bilateral: Secondary | ICD-10-CM | POA: Diagnosis not present

## 2019-06-11 DIAGNOSIS — J449 Chronic obstructive pulmonary disease, unspecified: Secondary | ICD-10-CM

## 2019-06-11 DIAGNOSIS — H9193 Unspecified hearing loss, bilateral: Secondary | ICD-10-CM | POA: Diagnosis not present

## 2019-06-11 DIAGNOSIS — I10 Essential (primary) hypertension: Secondary | ICD-10-CM

## 2019-06-11 DIAGNOSIS — J309 Allergic rhinitis, unspecified: Secondary | ICD-10-CM

## 2019-06-11 MED ORDER — BREO ELLIPTA 100-25 MCG/INH IN AEPB: INHALATION_SPRAY | RESPIRATORY_TRACT | 12 refills | Status: DC

## 2019-06-11 MED FILL — BREO ELLIPTA 100-25 MCG INH: 100-25 | 30 days supply | Qty: 60 | Fill #0

## 2019-06-11 NOTE — Assessment & Plan Note (Signed)
He remains very stable with little need for rescue inhaler. We are reducing Breo from 200 to 100. Consider ordering full PFT next year.

## 2019-06-11 NOTE — Assessment & Plan Note (Signed)
Seasonal allergy symptoms controlled for now, and generally better since he stopped doing his own yard work.  Plan- Flonase, Claritin as needed

## 2019-06-11 NOTE — Assessment & Plan Note (Signed)
Arrival BP 150/80, rechecked at end of visit 142/ 88 For f/u by PCP

## 2019-06-11 NOTE — Progress Notes (Signed)
Patient ID: Lucas Armstrong, male    DOB: [birth year], 73 y.o.   MRN: [id number]  HPI M former smoker followed for COPD/asthma, allergic rhinitis. Complicated by HBP, prostate cancer PFT 01/04/14- Office Spirometry 07/08/2015-severe obstructive airways disease with restricted vital capacity. FVC 2.33/55%, FEV1 1.36/41%, FEV1/FEC 0.58, FEF 25-75 percent 0.45/15% Resp Allergy Profile 06/10/17- broadly elevated -----------------------------------------------------------------------------------------------------------.  06/10/2018- 73 year old male former smoker followed for COPD mixed type, allergic rhinitis, complicated by HBP, prostate cancer Annual follow-up Ventolin HFA, Nasonex, Breo 200 Stable with "less asthma" through the summer.  Someone else does his yard work now which has made a big difference.  No recent prednisone.  Getting flu shot from his PCP in 2 weeks.  He is very comfortable with his medications, using rescue inhaler only every few weeks during mid summer and now about once a month. CXR 06/10/2017- COPD without acute abnormality.  06/11/2019- 73 year old male former smoker followed for COPD mixed type, allergic rhinitis, complicated by HBP, prostate cancer, Hard of Hearing Ventolin HFA, Nasonex, Breo 200 -----pt states breathing is at baseline, taking Breo 200 Gets flu vax at pending PCP visit. Rare need for rescue inhaler and no significant exacerbations this year. Seasonal rhinitis is well controlled currently.  Discussed reduction to Breo 100. Discussed Covid precautions and pending vaccine.  Denies heart problems or significant health changes. CXR 06/10/18-  Slight hyper aeration. No active lung disease. Linear scarring or atelectasis is noted at the left lung base.  // next year consider full PFT- severe obstruction on Spiro 2016 seems out of line//  Review of Systems- HPI + = positive Constitutional:   No-   weight loss, night sweats, fevers, chills, fatigue,  lassitude. HEENT:   No-  headaches, difficulty swallowing, tooth/dental problems, sore throat,       No- recent sneezing, itching, ear ache, +nasal congestion, post nasal drip,  CV:  No-   chest pain, orthopnea, PND, swelling in lower extremities, anasarca,   dizziness, palpitations Resp: + shortness of breath with exertion or at rest.              No-   productive cough,  No non-productive cough,  No- coughing up of blood.              No-   change in color of mucus. + rare wheezing.   Skin: No-   rash or lesions. GI:  No reflux GU:  MS:  No-   joint pain or swelling Neuro-     nothing unusual Psych:  No- change in mood or affect. No depression or anxiety.  No memory loss   Objective:   Physical Exam General- Alert, Oriented, Affect-appropriate, Distress- none acute, looks well Skin- rash-none, lesions- none, excoriation- none Lymphadenopathy- none Head- atraumatic            Eyes- Gross vision intact, PERRLA, conjunctivae- not injected            Ears- + hard of hearing            Nose- Clear, no-Septal dev, mucus, polyps, erosion, perforation             Throat- Mallampati II , mucosa clear , drainage- none, tonsils- atrophic Neck- flexible , trachea midline, no stridor , thyroid nl, carotid no bruit Chest - symmetrical excursion , unlabored           Heart/CV- RRR , no murmur , no gallop  , no rub, nl s1 s2                           -  JVD- none , edema- none, stasis changes- none, varices- none           Lung- + distant, wheeze- none, cough- none , dullness-none, rub- none           Chest wall-  Abd-  Br/ Gen/ Rectal- Not done, not indicated Extrem- cyanosis- none, clubbing, none, atrophy- none, strength- nl Neuro- grossly intact to observation

## 2019-06-11 NOTE — Patient Instructions (Signed)
Breo refill was sent, changing the strength to Breo 100, which should be fine for you.  Please call if we can help

## 2019-06-11 NOTE — Assessment & Plan Note (Signed)
Chronic impaired hearing was interfering some with communication through Covid masks.

## 2019-06-12 DIAGNOSIS — J449 Chronic obstructive pulmonary disease, unspecified: Secondary | ICD-10-CM | POA: Diagnosis not present

## 2019-06-12 DIAGNOSIS — E7800 Pure hypercholesterolemia, unspecified: Secondary | ICD-10-CM | POA: Diagnosis not present

## 2019-06-12 DIAGNOSIS — I10 Essential (primary) hypertension: Secondary | ICD-10-CM | POA: Diagnosis not present

## 2019-06-12 MED FILL — VALSARTAN-HCTZ 160-12.5 MG: 160-12.5 | 30 days supply | Qty: 30 | Fill #3

## 2019-06-15 DIAGNOSIS — Z23 Encounter for immunization: Secondary | ICD-10-CM | POA: Diagnosis not present

## 2019-06-15 DIAGNOSIS — I10 Essential (primary) hypertension: Secondary | ICD-10-CM | POA: Diagnosis not present

## 2019-06-15 DIAGNOSIS — E7800 Pure hypercholesterolemia, unspecified: Secondary | ICD-10-CM | POA: Diagnosis not present

## 2019-06-18 MED FILL — DESONIDE 0.05% CREAM: 0.05 | 14 days supply | Qty: 30 | Fill #0

## 2019-07-11 MED FILL — VENTOLIN HFA 90 MCG INHALER: 108 (90 BAS | 25 days supply | Qty: 18 | Fill #0

## 2019-07-11 MED FILL — VALSARTAN-HCTZ 160-12.5 MG: 160-12.5 | 30 days supply | Qty: 30 | Fill #4

## 2019-07-13 MED FILL — ATORVASTATIN 40 MG TABLET: 40 | 90 days supply | Qty: 90 | Fill #0

## 2019-07-17 MED FILL — BREO ELLIPTA 100-25 MCG INH: 100-25 | 30 days supply | Qty: 60 | Fill #1

## 2019-07-22 DIAGNOSIS — C6901 Malignant neoplasm of right conjunctiva: Secondary | ICD-10-CM | POA: Diagnosis not present

## 2019-08-10 MED FILL — VALSARTAN-HCTZ 160-12.5 MG: 160-12.5 | 30 days supply | Qty: 30 | Fill #5

## 2019-08-25 MED FILL — BREO ELLIPTA 100-25 MCG INH: 100-25 | 30 days supply | Qty: 60 | Fill #2

## 2019-09-07 DIAGNOSIS — L570 Actinic keratosis: Secondary | ICD-10-CM | POA: Diagnosis not present

## 2019-09-07 DIAGNOSIS — L219 Seborrheic dermatitis, unspecified: Secondary | ICD-10-CM | POA: Diagnosis not present

## 2019-09-07 MED FILL — KETOCONAZOLE 2% CREAM: 2 | 25 days supply | Qty: 60 | Fill #0

## 2019-09-15 MED FILL — VALSARTAN-HCTZ 160-12.5 MG: 160-12.5 | 30 days supply | Qty: 30 | Fill #0

## 2019-09-29 MED FILL — BREO ELLIPTA 100-25 MCG INH: 100-25 | 30 days supply | Qty: 60 | Fill #3

## 2019-10-13 MED FILL — VALSARTAN-HCTZ 160-12.5 MG: 160-12.5 | 30 days supply | Qty: 30 | Fill #1

## 2019-10-13 MED FILL — ATORVASTATIN 40 MG TABLET: 40 | 90 days supply | Qty: 90 | Fill #1

## 2019-11-01 ENCOUNTER — Ambulatory Visit: Payer: Medicare Other | Attending: Internal Medicine

## 2019-11-01 DIAGNOSIS — Z23 Encounter for immunization: Secondary | ICD-10-CM

## 2019-11-01 NOTE — Progress Notes (Signed)
   Covid-19 Vaccination Clinic  Name:  Lucas Armstrong    MRN: [id number] DOB: 1945/10/06  11/01/2019  [name] was observed post Covid-19 immunization for 15 minutes without incidence. He was provided with Vaccine Information Sheet and instruction to access the V-Safe system.   [name] was instructed to call 911 with any severe reactions post vaccine: Marland Kitchen Difficulty breathing  . Swelling of your face and throat  . A fast heartbeat  . A bad rash all over your body  . Dizziness and weakness    Immunizations Administered    Name Date Dose VIS Date Route   Pfizer COVID-19 Vaccine 11/01/2019 11:52 AM 0.3 mL 08/21/2019 Intramuscular   Manufacturer: [place]   Lot: [id number]   [place]: [id number]

## 2019-11-04 MED FILL — BREO ELLIPTA 100-25 MCG INH: 100-25 | 30 days supply | Qty: 60 | Fill #4

## 2019-11-10 MED FILL — VALSARTAN-HCTZ 160-12.5 MG: 160-12.5 | 30 days supply | Qty: 30 | Fill #2

## 2019-11-25 ENCOUNTER — Ambulatory Visit: Payer: Medicare Other | Attending: Internal Medicine

## 2019-11-25 DIAGNOSIS — Z23 Encounter for immunization: Secondary | ICD-10-CM

## 2019-11-25 NOTE — Progress Notes (Signed)
   Covid-19 Vaccination Clinic  Name:  Lucas Armstrong    MRN: [id number] DOB: August 18, 1946  11/25/2019  [name] was observed post Covid-19 immunization for 15 minutes without incident. He was provided with Vaccine Information Sheet and instruction to access the V-Safe system.   [name] was instructed to call 911 with any severe reactions post vaccine: Marland Kitchen Difficulty breathing  . Swelling of face and throat  . A fast heartbeat  . A bad rash all over body  . Dizziness and weakness   Immunizations Administered    Name Date Dose VIS Date Route   Pfizer COVID-19 Vaccine 11/25/2019  8:52 AM 0.3 mL 08/21/2019 Intramuscular   Manufacturer: [place]   Lot: [id number]   [place]: [id number]

## 2019-12-04 MED FILL — BREO ELLIPTA 100-25 MCG INH: 100-25 | 30 days supply | Qty: 60 | Fill #5

## 2019-12-14 DIAGNOSIS — I10 Essential (primary) hypertension: Secondary | ICD-10-CM | POA: Diagnosis not present

## 2019-12-14 DIAGNOSIS — J449 Chronic obstructive pulmonary disease, unspecified: Secondary | ICD-10-CM | POA: Diagnosis not present

## 2019-12-14 DIAGNOSIS — J309 Allergic rhinitis, unspecified: Secondary | ICD-10-CM | POA: Diagnosis not present

## 2019-12-14 DIAGNOSIS — E7800 Pure hypercholesterolemia, unspecified: Secondary | ICD-10-CM | POA: Diagnosis not present

## 2019-12-14 MED FILL — VALSARTAN-HCTZ 160-12.5 MG: 160-12.5 | 30 days supply | Qty: 30 | Fill #3

## 2020-01-07 MED FILL — VALSARTAN-HCTZ 160-12.5 MG: 160-12.5 | 90 days supply | Qty: 90 | Fill #0

## 2020-01-07 MED FILL — BREO ELLIPTA 100-25 MCG INH: 100-25 | 30 days supply | Qty: 60 | Fill #6

## 2020-01-20 DIAGNOSIS — C6901 Malignant neoplasm of right conjunctiva: Secondary | ICD-10-CM | POA: Diagnosis not present

## 2020-01-29 ENCOUNTER — Other Ambulatory Visit: Payer: Self-pay | Admitting: Dermatology

## 2020-01-29 MED FILL — DESONIDE 0.05% CREAM: 0.05 | 14 days supply | Qty: 30 | Fill #0

## 2020-02-10 MED FILL — BREO ELLIPTA 100-25 MCG INH: 100-25 | 30 days supply | Qty: 60 | Fill #7

## 2020-03-15 MED FILL — BREO ELLIPTA 100-25 MCG INH: 100-25 | 30 days supply | Qty: 60 | Fill #8

## 2020-03-29 DIAGNOSIS — R3915 Urgency of urination: Secondary | ICD-10-CM | POA: Diagnosis not present

## 2020-03-29 DIAGNOSIS — C61 Malignant neoplasm of prostate: Secondary | ICD-10-CM | POA: Diagnosis not present

## 2020-04-13 MED FILL — VALSARTAN-HCTZ 160-12.5 MG: 160-12.5 | 90 days supply | Qty: 90 | Fill #1

## 2020-04-15 MED FILL — ATORVASTATIN CALCIUM 40 MG: 40 | 90 days supply | Qty: 90 | Fill #0

## 2020-04-20 MED FILL — BREO ELLIPTA 100-25 MCG INH: 100-25 | 30 days supply | Qty: 60 | Fill #9

## 2020-05-25 MED FILL — BREO ELLIPTA 100-25 MCG INH: 100-25 | 30 days supply | Qty: 60 | Fill #10

## 2020-05-26 MED FILL — DESONIDE 0.05% CREAM: 0.05 | 14 days supply | Qty: 30 | Fill #1

## 2020-06-10 ENCOUNTER — Ambulatory Visit (INDEPENDENT_AMBULATORY_CARE_PROVIDER_SITE_OTHER): Payer: Medicare Other

## 2020-06-10 ENCOUNTER — Encounter: Payer: Self-pay | Admitting: Internal Medicine

## 2020-06-10 ENCOUNTER — Ambulatory Visit (INDEPENDENT_AMBULATORY_CARE_PROVIDER_SITE_OTHER): Payer: Medicare Other | Admitting: Internal Medicine

## 2020-06-10 ENCOUNTER — Other Ambulatory Visit: Payer: Self-pay

## 2020-06-10 VITALS — BP 132/84 | HR 80 | Temp 98.3°F | Ht 69.0 in | Wt 164.1 lb

## 2020-06-10 DIAGNOSIS — H1013 Acute atopic conjunctivitis, bilateral: Secondary | ICD-10-CM | POA: Diagnosis not present

## 2020-06-10 DIAGNOSIS — J984 Other disorders of lung: Secondary | ICD-10-CM | POA: Diagnosis not present

## 2020-06-10 DIAGNOSIS — C61 Malignant neoplasm of prostate: Secondary | ICD-10-CM

## 2020-06-10 DIAGNOSIS — J309 Allergic rhinitis, unspecified: Secondary | ICD-10-CM

## 2020-06-10 DIAGNOSIS — J449 Chronic obstructive pulmonary disease, unspecified: Secondary | ICD-10-CM

## 2020-06-10 DIAGNOSIS — J45909 Unspecified asthma, uncomplicated: Secondary | ICD-10-CM | POA: Diagnosis not present

## 2020-06-10 MED ORDER — BREZTRI AEROSPHERE 160-9-4.8 MCG/ACT IN AERO: 2.0000 | INHALATION_SPRAY | Freq: Two times a day (BID) | RESPIRATORY_TRACT | 0 refills | Status: DC

## 2020-06-10 NOTE — Progress Notes (Signed)
Patient ID: Lucas Armstrong, male    DOB: [birth year], 74 y.o.   MRN: [id number]  HPI M former(minimal) smoker followed for COPD/asthma, allergic rhinitis. Complicated by HBP, prostate cancer PFT 01/04/14- Moderate obstruction, signif resp to BD, Nl Diffusion Office Spirometry 07/08/2015-severe obstructive airways disease with restricted vital capacity. FVC 2.33/55%, FEV1 1.36/41%, FEV1/FEC 0.58, FEF 25-75 percent 0.45/15% Resp Allergy Profile 06/10/17- broadly elevated -----------------------------------------------------------------------------------------------------------.   06/11/2019- 74 year old male former(minimal) smoker followed for COPD mixed type, allergic rhinitis, complicated by HBP, prostate cancer, Hard of Hearing Ventolin HFA, Nasonex, Breo 200 -----pt states breathing is at baseline, taking Breo 200 Gets flu vax at pending PCP visit. Rare need for rescue inhaler and no significant exacerbations this year. Seasonal rhinitis is well controlled currently.  Discussed reduction to Breo 100. Discussed Covid precautions and pending vaccine.  Denies heart problems or significant health changes. CXR 06/10/18-  Slight hyper aeration. No active lung disease. Linear scarring or atelectasis is noted at the left lung base.  06/10/20- 74 year old male former(minimal) smoker followed for COPD mixed type, allergic rhinitis, complicated by HBP, prostate cancer, Hard of Hearing Ventolin HFA, Nasonex, Breo 200 Covid vax- 2 Phizer Flu vax- scheduled Worse asthma last 2 weekends- unsure why. Not outside much. Says Fall leaves/ season are historically trigger times. Continues Breo 200. Used rescue inhaler 2-3x/ day. Better now. Now obvious infection. No flare rhinitis but eyes are itching.   Review of Systems- HPI + = positive Constitutional:   No-   weight loss, night sweats, fevers, chills, fatigue, lassitude. HEENT:   No-  headaches, difficulty swallowing, tooth/dental problems, sore  throat,       No- recent sneezing, itching, ear ache, +nasal congestion, post nasal drip,  CV:  No-   chest pain, orthopnea, PND, swelling in lower extremities, anasarca,   dizziness, palpitations Resp: + shortness of breath with exertion or at rest.              No-   productive cough,  No non-productive cough,  No- coughing up of blood.              No-   change in color of mucus. + rare wheezing.   Skin: No-   rash or lesions. GI:  No reflux GU:  MS:  No-   joint pain or swelling Neuro-     nothing unusual Psych:  No- change in mood or affect. No depression or anxiety.  No memory loss   Objective:   Physical Exam General- Alert, Oriented, Affect-appropriate, Distress- none acute, looks well Skin- rash-none, lesions- none, excoriation- none Lymphadenopathy- none Head- atraumatic            Eyes- Gross vision intact, PERRLA, conjunctivae- not injected            Ears- + hard of hearing            Nose- Clear, no-Septal dev, mucus, polyps, erosion, perforation             Throat- Mallampati II , mucosa clear , drainage- none, tonsils- atrophic Neck- flexible , trachea midline, no stridor , thyroid nl, carotid no bruit Chest - symmetrical excursion , unlabored           Heart/CV- RRR , no murmur , no gallop  , no rub, nl s1 s2                           - JVD- none ,  edema- none, stasis changes- none, varices- none           Lung- + distant, wheeze- none, cough- none , dullness-none, rub- none           Chest wall-  Abd-  Br/ Gen/ Rectal- Not done, not indicated Extrem- cyanosis- none, clubbing, none, atrophy- none, strength- nl Neuro- grossly intact to observation

## 2020-06-10 NOTE — Assessment & Plan Note (Signed)
Observation status for now.

## 2020-06-10 NOTE — Patient Instructions (Signed)
Order- CXR   Dx asthma moderate intermittent   Sample x 2 Breztri inhaler    Inhale 2 puffs then rinse mouth, twice daily. Try this instead of Breo. If you like it better let us know, otherwise just stay with Breo.  Agree with plans for Covid booster and flu shot this Fall  Please call if we can help

## 2020-06-10 NOTE — Assessment & Plan Note (Signed)
Exacerbation of asthma pattern. Likely reflects Fall seasonal factors, with distinct weather change in past 2 weeks. Plan- try samples Breztri (since we don't have Trelegy) instead of Breo. Consider nebulizer.  Get flu and booster vax as able

## 2020-06-10 NOTE — Assessment & Plan Note (Signed)
Increased allergic conjunctivitis. Plan - eye drops and antihistamines as needed.

## 2020-06-13 ENCOUNTER — Telehealth: Payer: Self-pay

## 2020-06-13 ENCOUNTER — Telehealth: Payer: Self-pay | Admitting: Internal Medicine

## 2020-06-13 NOTE — Telephone Encounter (Signed)
Pt was informed od cxr results

## 2020-06-13 NOTE — Progress Notes (Signed)
LMTCB

## 2020-06-13 NOTE — Telephone Encounter (Signed)
Baird Lyons D, MD sent to [facility] Triage Pool CXR- stable changes of asthma and COPD with old scarring, but no new or active concern.   Called pt back and he did not answer so LMTCB x 1

## 2020-06-13 NOTE — Telephone Encounter (Signed)
Pt is returning call - CB# [phone]

## 2020-06-16 ENCOUNTER — Other Ambulatory Visit (HOSPITAL_COMMUNITY): Payer: Self-pay | Admitting: Ophthalmology

## 2020-06-16 MED FILL — FLUOROMETHOLONE 0.1% DROPS: 0.1 | 25 days supply | Qty: 5 | Fill #0

## 2020-06-16 NOTE — Telephone Encounter (Signed)
Lmtcb for pt.  

## 2020-06-20 NOTE — Telephone Encounter (Signed)
LMTCB x2 for pt 

## 2020-06-21 ENCOUNTER — Encounter: Payer: Self-pay | Admitting: Emergency Medicine

## 2020-06-21 NOTE — Telephone Encounter (Signed)
lmtcb for pt. Letter has been printed and placed in outgoing mail. Will sign off.

## 2020-06-22 ENCOUNTER — Telehealth: Payer: Self-pay | Admitting: Internal Medicine

## 2020-06-22 NOTE — Telephone Encounter (Signed)
Pt stated that he had already gotten the results of the cxr. Pt not sure of what the call was for. Nothing further needed.

## 2020-06-22 NOTE — Telephone Encounter (Signed)
Noted  

## 2020-06-24 ENCOUNTER — Other Ambulatory Visit: Payer: Self-pay | Admitting: *Deleted

## 2020-06-24 MED ORDER — BETAMETHASONE DIPROPIONATE AUG 0.05 % EX CREA: TOPICAL_CREAM | Freq: Two times a day (BID) | CUTANEOUS | 2 refills | Status: DC

## 2020-06-27 ENCOUNTER — Other Ambulatory Visit: Payer: Self-pay | Admitting: Internal Medicine

## 2020-06-27 MED FILL — BREO ELLIPTA 100-25 MCG INH: 100-25 | 30 days supply | Qty: 60 | Fill #0

## 2020-06-28 ENCOUNTER — Telehealth: Payer: Self-pay | Admitting: Internal Medicine

## 2020-06-28 ENCOUNTER — Other Ambulatory Visit: Payer: Self-pay | Admitting: Dermatology

## 2020-06-28 NOTE — Telephone Encounter (Signed)
Pt came in to the office about a letter that was received about his cxr results, pt has already been given  The results from cxr, Nothing further is needed.

## 2020-06-30 ENCOUNTER — Encounter: Payer: Self-pay | Admitting: Internal Medicine

## 2020-07-01 ENCOUNTER — Telehealth: Payer: Self-pay | Admitting: Internal Medicine

## 2020-07-01 NOTE — Telephone Encounter (Signed)
Noted. Nothing further needed. 

## 2020-07-04 NOTE — Telephone Encounter (Signed)
Pt is currently taking breo but has already picked up from pharmacy

## 2020-07-18 ENCOUNTER — Other Ambulatory Visit (HOSPITAL_COMMUNITY): Payer: Self-pay | Admitting: Family Medicine

## 2020-07-18 DIAGNOSIS — I10 Essential (primary) hypertension: Secondary | ICD-10-CM | POA: Diagnosis not present

## 2020-07-18 DIAGNOSIS — Z0000 Encounter for general adult medical examination without abnormal findings: Secondary | ICD-10-CM | POA: Diagnosis not present

## 2020-07-18 DIAGNOSIS — J449 Chronic obstructive pulmonary disease, unspecified: Secondary | ICD-10-CM | POA: Diagnosis not present

## 2020-07-18 DIAGNOSIS — E7800 Pure hypercholesterolemia, unspecified: Secondary | ICD-10-CM | POA: Diagnosis not present

## 2020-07-18 MED FILL — VALSARTAN-HCTZ 160-12.5 MG: 160-12.5 | 90 days supply | Qty: 90 | Fill #0

## 2020-07-18 MED FILL — ATORVASTATIN 40 MG TABLET: 40 | 90 days supply | Qty: 90 | Fill #0

## 2020-07-20 ENCOUNTER — Other Ambulatory Visit: Payer: Self-pay | Admitting: Internal Medicine

## 2020-07-20 MED FILL — VENTOLIN HFA 90 MCG INHALER: 108 (90 BAS | 16 days supply | Qty: 18 | Fill #0

## 2020-07-25 ENCOUNTER — Ambulatory Visit: Payer: Medicare Other | Admitting: Dermatology

## 2020-07-25 ENCOUNTER — Encounter: Payer: Self-pay | Admitting: Dermatology

## 2020-07-25 ENCOUNTER — Other Ambulatory Visit: Payer: Self-pay

## 2020-07-25 DIAGNOSIS — Z1283 Encounter for screening for malignant neoplasm of skin: Secondary | ICD-10-CM | POA: Diagnosis not present

## 2020-07-25 DIAGNOSIS — D485 Neoplasm of uncertain behavior of skin: Secondary | ICD-10-CM | POA: Diagnosis not present

## 2020-07-25 DIAGNOSIS — L570 Actinic keratosis: Secondary | ICD-10-CM | POA: Diagnosis not present

## 2020-07-25 DIAGNOSIS — L309 Dermatitis, unspecified: Secondary | ICD-10-CM

## 2020-07-25 DIAGNOSIS — C44511 Basal cell carcinoma of skin of breast: Secondary | ICD-10-CM | POA: Diagnosis not present

## 2020-07-25 DIAGNOSIS — Z85828 Personal history of other malignant neoplasm of skin: Secondary | ICD-10-CM

## 2020-07-25 DIAGNOSIS — L300 Nummular dermatitis: Secondary | ICD-10-CM

## 2020-07-25 DIAGNOSIS — L820 Inflamed seborrheic keratosis: Secondary | ICD-10-CM | POA: Diagnosis not present

## 2020-07-25 NOTE — Patient Instructions (Signed)

## 2020-07-26 MED FILL — BREO ELLIPTA 100-25 MCG INH: 100-25 | 30 days supply | Qty: 60 | Fill #1

## 2020-07-28 ENCOUNTER — Encounter: Payer: Self-pay | Admitting: Dermatology

## 2020-07-28 ENCOUNTER — Telehealth: Payer: Self-pay | Admitting: *Deleted

## 2020-07-28 NOTE — Telephone Encounter (Signed)
-----   Message from Lavonna Monarch, MD sent at 07/28/2020  6:55 AM EST ----- Schedule surgery with Dr. Darene Lamer

## 2020-07-28 NOTE — Progress Notes (Signed)
   Follow-Up Visit   Subjective  Lucas Armstrong is a 75 y.o. male who presents for the following: Annual Exam (Patient here today for skin check. Per patient he has one spot on the right temple that he would like looked at x months comes and goes). Growths Location: 1 on right temple recurs and gross. Duration:  Quality:  Associated Signs/Symptoms: Modifying Factors:  Severity:  Timing: Context: History of skin cancer  Objective  Well appearing patient in no apparent distress; mood and affect are within normal limits.  A full examination was performed including scalp, head, eyes, ears, nose, lips, neck, chest, axillae, abdomen, back, buttocks, bilateral upper extremities, bilateral lower extremities, hands, feet, fingers, toes, fingernails, and toenails. All findings within normal limits unless otherwise noted below.   Assessment & Plan    Skin exam for malignant neoplasm Head - Anterior (Face)  Self examine twice annually  History of basal cell carcinoma (BCC) Left Shoulder - Anterior  Annual skin examination  Neoplasm of uncertain behavior of skin (2) Right Temporal Scalp  Skin / nail biopsy Type of biopsy: tangential   Informed consent: discussed and consent obtained   Timeout: patient name, date of birth, surgical site, and procedure verified   Anesthesia: the lesion was anesthetized in a standard fashion   Anesthetic:  1% lidocaine w/ epinephrine 1-100,000 local infiltration Instrument used: flexible razor blade   Hemostasis achieved with: ferric subsulfate   Outcome: patient tolerated procedure well   Post-procedure details: wound care instructions given    Specimen 1 - Surgical pathology Differential Diagnosis: scc vs bcc Check Margins: No  Left Breast  Skin / nail biopsy Type of biopsy: tangential   Informed consent: discussed and consent obtained   Timeout: patient name, date of birth, surgical site, and procedure verified   Anesthesia: the  lesion was anesthetized in a standard fashion   Anesthetic:  1% lidocaine w/ epinephrine 1-100,000 local infiltration Instrument used: flexible razor blade   Hemostasis achieved with: ferric subsulfate   Outcome: patient tolerated procedure well   Post-procedure details: wound care instructions given    Specimen 2 - Surgical pathology Differential Diagnosis: scc vs bcc Check Margins: No  AK (actinic keratosis) (5) Mid Frontal Scalp; Mid Forehead; Left Temple (2); Right Temple  Destruction of lesion - Left Temple, Mid Forehead, Mid Frontal Scalp, Right Temple Complexity: simple   Destruction method: cryotherapy   Informed consent: discussed and consent obtained   Timeout:  patient name, date of birth, surgical site, and procedure verified Lesion destroyed using liquid nitrogen: Yes   Cryotherapy cycles:  5 Outcome: patient tolerated procedure well with no complications   Post-procedure details: wound care instructions given       I, Lavonna Monarch, MD, have reviewed all documentation for this visit.  The documentation on 07/28/20 for the exam, diagnosis, procedures, and orders are all accurate and complete.

## 2020-07-28 NOTE — Telephone Encounter (Signed)
Path to patient made surgery appointment with Dr.Tafeen.

## 2020-07-28 NOTE — Telephone Encounter (Signed)
Left message for patient to call back about his pathology report.

## 2020-08-25 MED FILL — BREO ELLIPTA 100-25 MCG INH: 100-25 | 30 days supply | Qty: 60 | Fill #2

## 2020-09-14 DIAGNOSIS — Z961 Presence of intraocular lens: Secondary | ICD-10-CM | POA: Diagnosis not present

## 2020-09-14 DIAGNOSIS — C6901 Malignant neoplasm of right conjunctiva: Secondary | ICD-10-CM | POA: Diagnosis not present

## 2020-09-27 MED FILL — BREO ELLIPTA 100-25 MCG INH: 100-25 | 30 days supply | Qty: 60 | Fill #3

## 2020-09-29 DIAGNOSIS — C61 Malignant neoplasm of prostate: Secondary | ICD-10-CM | POA: Diagnosis not present

## 2020-10-06 ENCOUNTER — Ambulatory Visit (INDEPENDENT_AMBULATORY_CARE_PROVIDER_SITE_OTHER): Payer: Medicare Other | Admitting: Dermatology

## 2020-10-06 ENCOUNTER — Encounter: Payer: Self-pay | Admitting: Dermatology

## 2020-10-06 ENCOUNTER — Other Ambulatory Visit: Payer: Self-pay

## 2020-10-06 DIAGNOSIS — D099 Carcinoma in situ, unspecified: Secondary | ICD-10-CM

## 2020-10-06 DIAGNOSIS — D045 Carcinoma in situ of skin of trunk: Secondary | ICD-10-CM | POA: Diagnosis not present

## 2020-10-06 NOTE — Patient Instructions (Signed)

## 2020-10-12 ENCOUNTER — Encounter: Payer: Self-pay | Admitting: Dermatology

## 2020-10-12 NOTE — Progress Notes (Signed)
   Follow-Up Visit   Subjective  Lucas Armstrong is a 75 y.o. male who presents for the following: Procedure (Here for treatment- left breast- bcc x 1 ).  BCC Location:  Duration:  Quality:  Associated Signs/Symptoms: Modifying Factors:  Severity:  Timing: Context: For treatment  Objective  Well appearing patient in no apparent distress; mood and affect are within normal limits. Objective  Left Breast: Biopsy site identified by Dr.Fumiko Cham and nurse in room.     All skin waist up examined.   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  Destruction of lesion Complexity: simple   Destruction method: electrodesiccation and curettage   Informed consent: discussed and consent obtained   Timeout:  patient name, date of birth, surgical site, and procedure verified Anesthesia: the lesion was anesthetized in a standard fashion   Anesthetic:  1% lidocaine w/ epinephrine 1-100,000 local infiltration Curettage performed in three different directions: Yes   Curettage cycles:  3 Lesion length (cm):  1.2 Lesion width (cm):  1.2 Margin per side (cm):  0 Final wound size (cm):  1.2 Hemostasis achieved with:  ferric subsulfate Outcome: patient tolerated procedure well with no complications   Additional details:  Wound innoculated with 5 fluorouracil solution.     I, Lavonna Monarch, MD, have reviewed all documentation for this visit.  The documentation on 10/12/20 for the exam, diagnosis, procedures, and orders are all accurate and complete.

## 2020-10-17 MED FILL — VALSARTAN-HCTZ 160-12.5 MG: 160-12.5 | 90 days supply | Qty: 90 | Fill #1

## 2020-10-17 MED FILL — ATORVASTATIN 40 MG TABLET: 40 | 90 days supply | Qty: 90 | Fill #1

## 2020-10-20 ENCOUNTER — Telehealth: Payer: Self-pay | Admitting: Dermatology

## 2020-10-20 NOTE — Telephone Encounter (Signed)
FYI: healed up well & he's doin' fine

## 2020-10-31 MED FILL — BREO ELLIPTA 100-25 MCG INH: 100-25 | 30 days supply | Qty: 60 | Fill #4

## 2020-10-31 MED FILL — DESONIDE 0.05% CREAM: 0.05 | 14 days supply | Qty: 30 | Fill #2

## 2020-12-01 MED FILL — BREO ELLIPTA 100-25 MCG INH: 100-25 | 30 days supply | Qty: 60 | Fill #5

## 2021-01-03 ENCOUNTER — Other Ambulatory Visit (HOSPITAL_COMMUNITY): Payer: Self-pay

## 2021-01-03 MED FILL — Fluticasone Furoate-Vilanterol Aero Powd BA 100-25 MCG/ACT: RESPIRATORY_TRACT | 30 days supply | Qty: 60 | Fill #0 | Status: AC

## 2021-01-04 ENCOUNTER — Other Ambulatory Visit (HOSPITAL_COMMUNITY): Payer: Self-pay

## 2021-01-12 DIAGNOSIS — S30861A Insect bite (nonvenomous) of abdominal wall, initial encounter: Secondary | ICD-10-CM | POA: Diagnosis not present

## 2021-01-12 DIAGNOSIS — W57XXXA Bitten or stung by nonvenomous insect and other nonvenomous arthropods, initial encounter: Secondary | ICD-10-CM | POA: Diagnosis not present

## 2021-01-16 ENCOUNTER — Other Ambulatory Visit (HOSPITAL_COMMUNITY): Payer: Self-pay

## 2021-01-16 DIAGNOSIS — I10 Essential (primary) hypertension: Secondary | ICD-10-CM | POA: Diagnosis not present

## 2021-01-16 DIAGNOSIS — J449 Chronic obstructive pulmonary disease, unspecified: Secondary | ICD-10-CM | POA: Diagnosis not present

## 2021-01-16 DIAGNOSIS — J309 Allergic rhinitis, unspecified: Secondary | ICD-10-CM | POA: Diagnosis not present

## 2021-01-16 DIAGNOSIS — E7800 Pure hypercholesterolemia, unspecified: Secondary | ICD-10-CM | POA: Diagnosis not present

## 2021-01-16 MED ORDER — ATORVASTATIN CALCIUM 40 MG PO TABS
ORAL_TABLET | ORAL | 4 refills | Status: DC
  Filled 2021-01-16: qty 90, 90d supply, fill #0
  Filled 2021-04-19: qty 90, 90d supply, fill #1
  Filled 2021-07-19: qty 90, 90d supply, fill #2
  Filled 2021-10-23: qty 90, 90d supply, fill #3

## 2021-01-16 MED ORDER — VALSARTAN-HYDROCHLOROTHIAZIDE 160-12.5 MG PO TABS
ORAL_TABLET | ORAL | 3 refills | Status: DC
  Filled 2021-01-16: qty 90, 90d supply, fill #0
  Filled 2021-04-19: qty 90, 90d supply, fill #1
  Filled 2021-07-19: qty 90, 90d supply, fill #2
  Filled 2021-10-23: qty 90, 90d supply, fill #3

## 2021-02-07 ENCOUNTER — Other Ambulatory Visit (HOSPITAL_COMMUNITY): Payer: Self-pay

## 2021-02-07 MED FILL — Fluticasone Furoate-Vilanterol Aero Powd BA 100-25 MCG/ACT: RESPIRATORY_TRACT | 30 days supply | Qty: 60 | Fill #1 | Status: AC

## 2021-03-09 ENCOUNTER — Other Ambulatory Visit: Payer: Self-pay | Admitting: Dermatology

## 2021-03-09 ENCOUNTER — Other Ambulatory Visit (HOSPITAL_COMMUNITY): Payer: Self-pay

## 2021-03-09 MED ORDER — DESONIDE 0.05 % EX CREA
TOPICAL_CREAM | CUTANEOUS | 3 refills | Status: DC
  Filled 2021-03-09: qty 30, fill #0
  Filled 2021-03-22: qty 30, 30d supply, fill #0

## 2021-03-09 MED FILL — Fluticasone Furoate-Vilanterol Aero Powd BA 100-25 MCG/ACT: RESPIRATORY_TRACT | 30 days supply | Qty: 60 | Fill #2 | Status: AC

## 2021-03-11 ENCOUNTER — Other Ambulatory Visit (HOSPITAL_COMMUNITY): Payer: Self-pay

## 2021-03-14 ENCOUNTER — Other Ambulatory Visit (HOSPITAL_COMMUNITY): Payer: Self-pay

## 2021-03-22 ENCOUNTER — Other Ambulatory Visit (HOSPITAL_COMMUNITY): Payer: Self-pay

## 2021-03-23 ENCOUNTER — Other Ambulatory Visit (HOSPITAL_COMMUNITY): Payer: Self-pay

## 2021-03-27 DIAGNOSIS — C61 Malignant neoplasm of prostate: Secondary | ICD-10-CM | POA: Diagnosis not present

## 2021-03-30 IMAGING — DX DG CHEST 2V
3 series · 3 of 3 positions shown · non-contrast
Comparison: 06/10/2018 chest radiograph.

CLINICAL DATA: Asthma, moderate intermittent

EXAM:
CHEST - 2 VIEW

[chest pa]
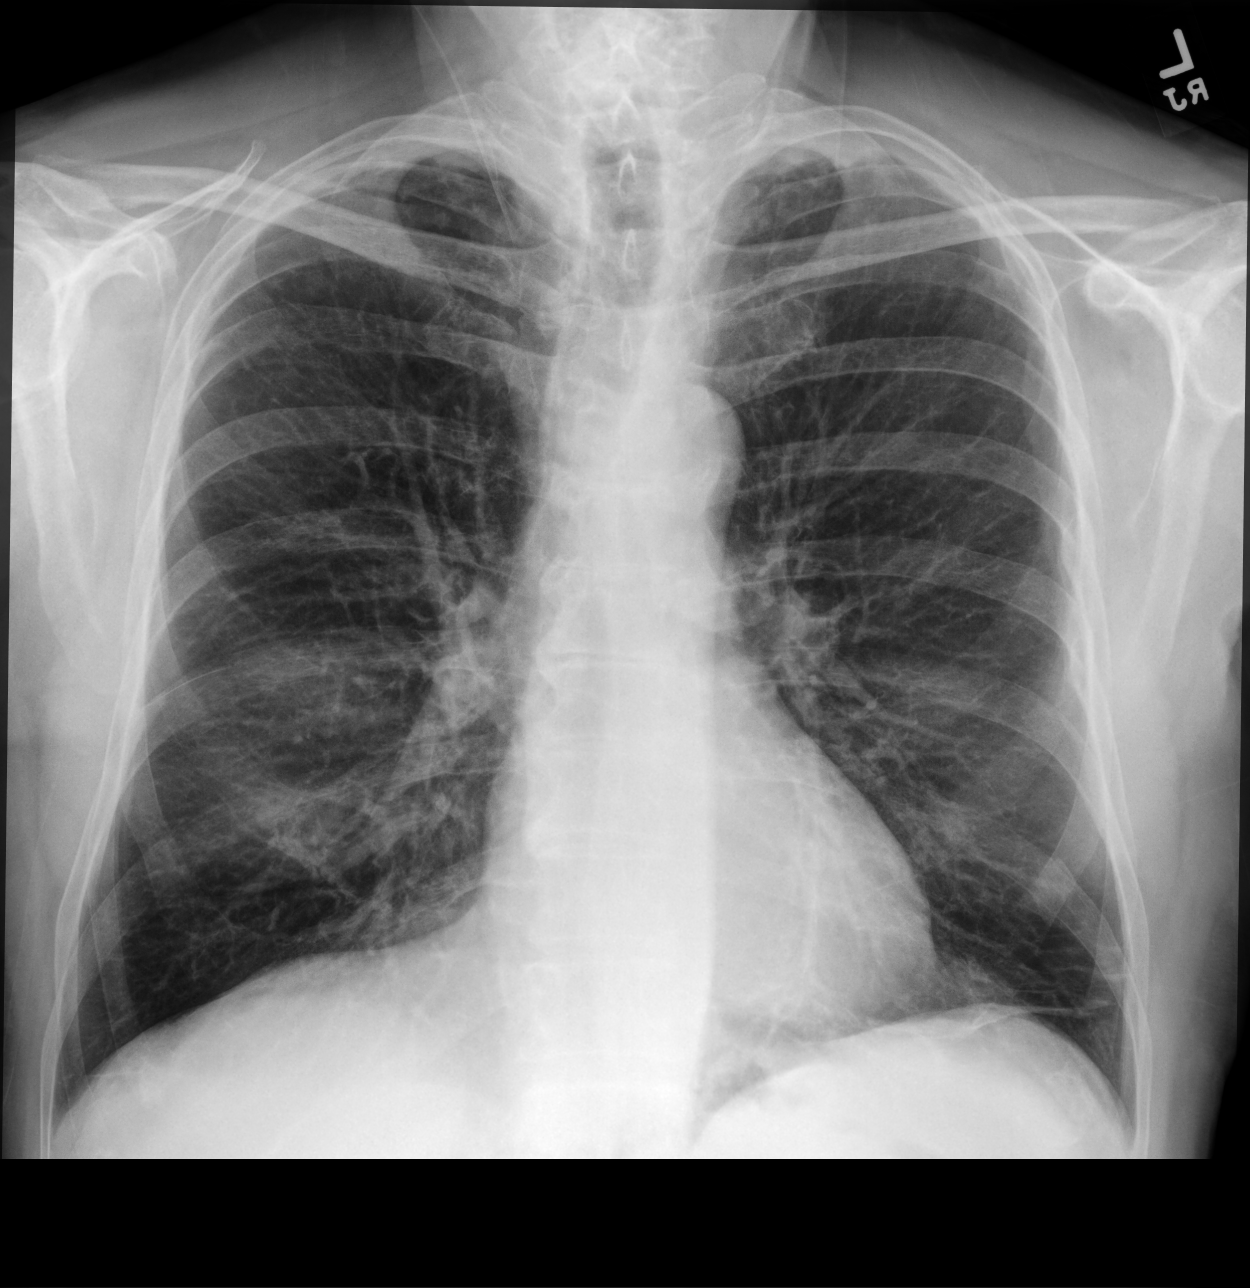

[chest lat (1 of 2)]
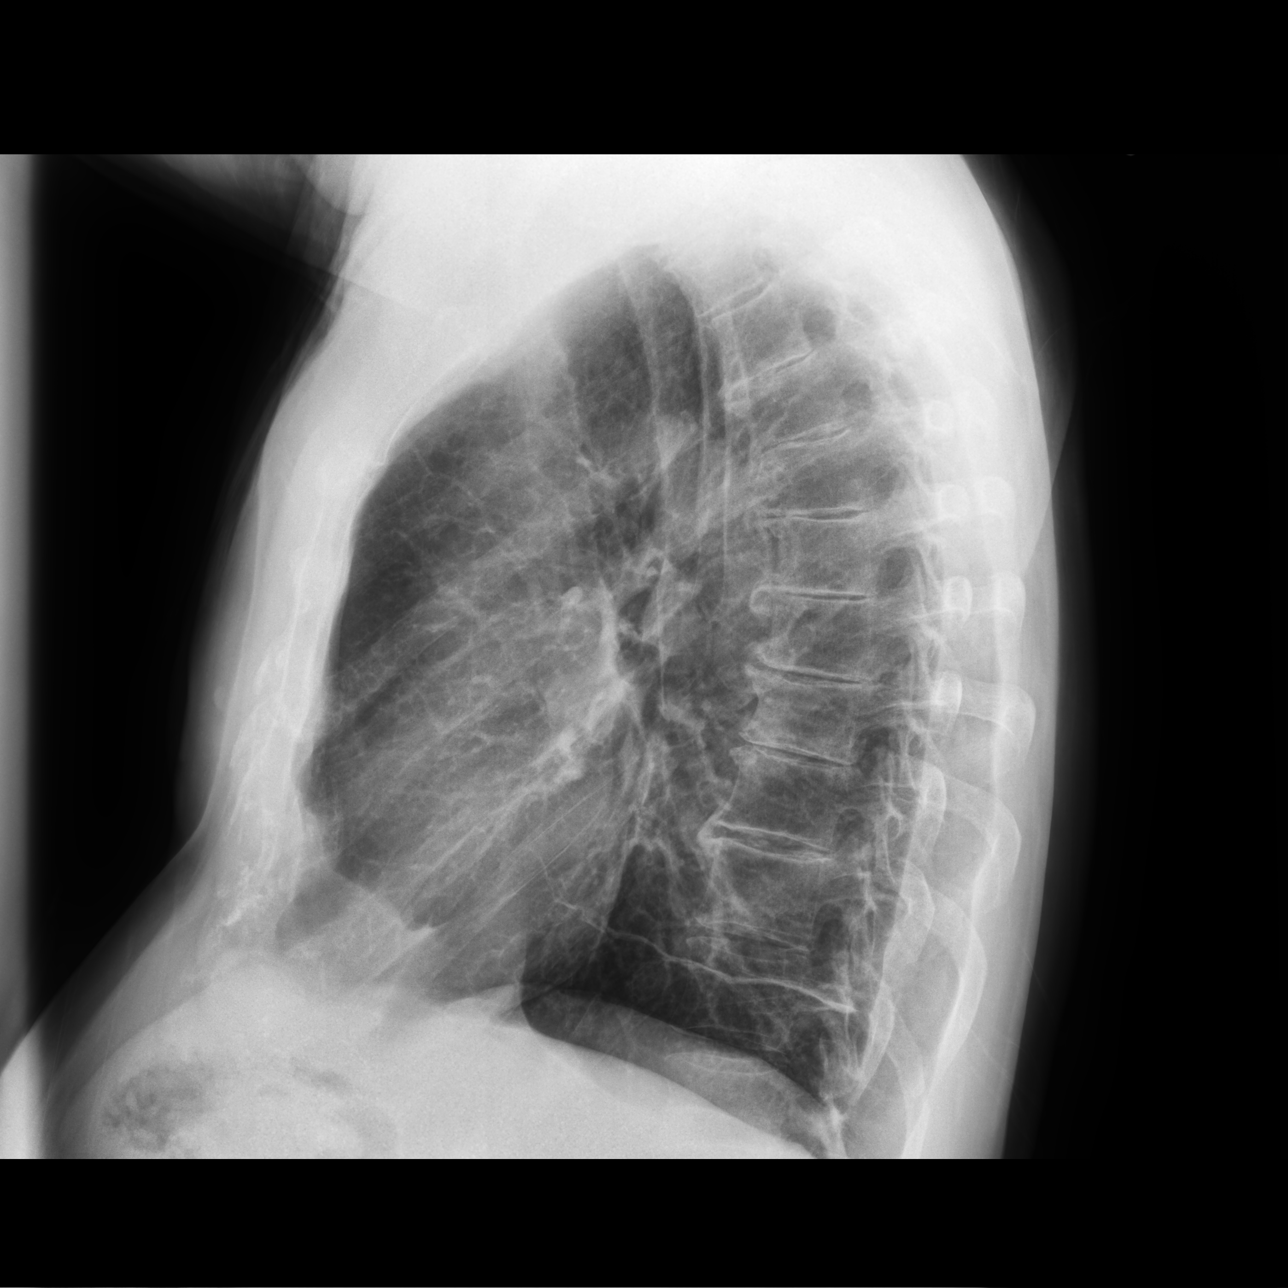

[chest lat (2 of 2)]
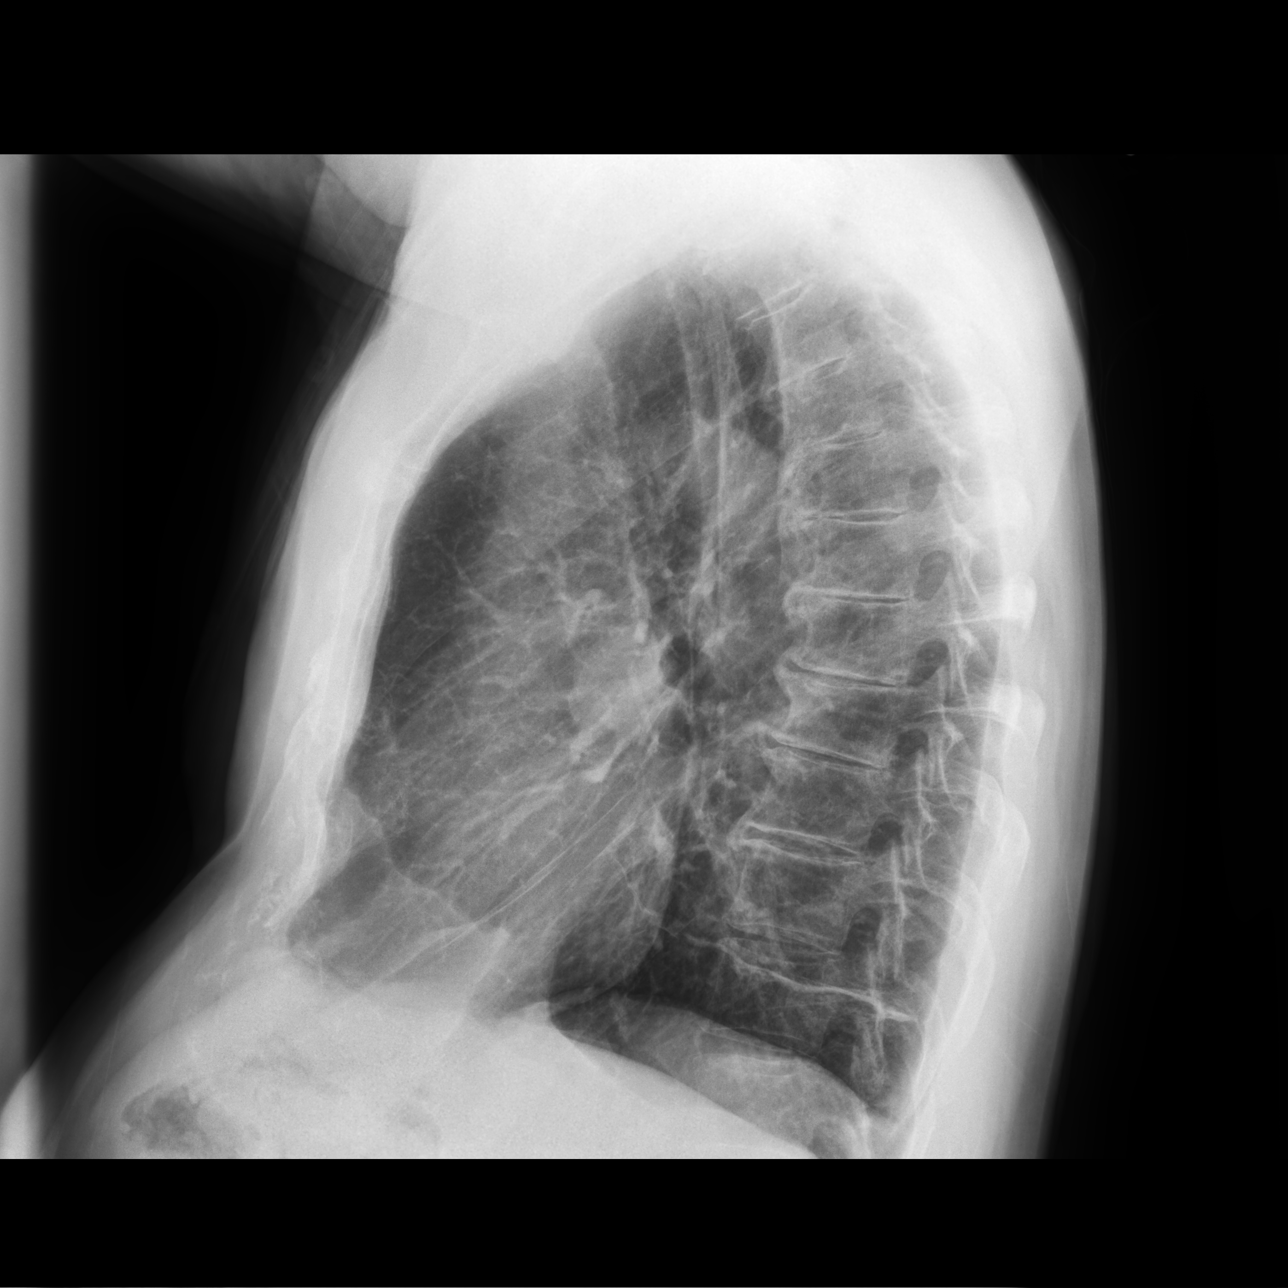

[3 of 3 positions shown; findings below may reference images not displayed]

FINDINGS: Stable cardiomediastinal silhouette with normal heart size. No
pneumothorax. No pleural effusion. Hyperinflated lungs. Curvilinear
scarring at the left lung base is unchanged. No pulmonary edema. No
acute consolidative airspace disease. Biapical pleural-parenchymal
scarring is unchanged.
IMPRESSION: 1. No acute cardiopulmonary disease.
2. Hyperinflated lungs, compatible with reported obstructive lung
disease.

## 2021-04-03 DIAGNOSIS — R3915 Urgency of urination: Secondary | ICD-10-CM | POA: Diagnosis not present

## 2021-04-03 DIAGNOSIS — Z8546 Personal history of malignant neoplasm of prostate: Secondary | ICD-10-CM | POA: Diagnosis not present

## 2021-04-11 ENCOUNTER — Other Ambulatory Visit (HOSPITAL_COMMUNITY): Payer: Self-pay

## 2021-04-11 MED FILL — Fluticasone Furoate-Vilanterol Aero Powd BA 100-25 MCG/ACT: RESPIRATORY_TRACT | 30 days supply | Qty: 60 | Fill #3 | Status: AC

## 2021-04-13 MED ORDER — BETAMETHASONE DIPROPIONATE 0.05 % EX CREA: TOPICAL_CREAM | CUTANEOUS | 6 refills | Status: DC

## 2021-04-13 NOTE — Addendum Note (Signed)
Addended by: Lavonna Monarch on: 04/13/2021 01:35 PM   Modules accepted: Orders

## 2021-04-19 ENCOUNTER — Other Ambulatory Visit (HOSPITAL_COMMUNITY): Payer: Self-pay

## 2021-05-10 DIAGNOSIS — Z961 Presence of intraocular lens: Secondary | ICD-10-CM | POA: Diagnosis not present

## 2021-05-10 DIAGNOSIS — C6901 Malignant neoplasm of right conjunctiva: Secondary | ICD-10-CM | POA: Diagnosis not present

## 2021-05-11 ENCOUNTER — Other Ambulatory Visit (HOSPITAL_COMMUNITY): Payer: Self-pay

## 2021-05-11 MED FILL — Fluticasone Furoate-Vilanterol Aero Powd BA 100-25 MCG/ACT: RESPIRATORY_TRACT | 30 days supply | Qty: 60 | Fill #4 | Status: AC

## 2021-06-10 NOTE — Progress Notes (Signed)
Patient ID: Lucas Armstrong, male    DOB: 1945/12/25, 75 y.o.   MRN: [id number]  HPI M former(minimal) smoker followed for COPD/asthma, allergic rhinitis. Complicated by HBP, prostate cancer PFT 01/04/14- Moderate obstruction, signif resp to BD, Nl Diffusion Office Spirometry 07/08/2015-severe obstructive airways disease with restricted vital capacity. FVC 2.33/55%, FEV1 1.36/41%, FEV1/FEC 0.58, FEF 25-75 percent 0.45/15% Resp Allergy Profile 06/10/17- broadly elevated -----------------------------------------------------------------------------------------------------------.  06/10/20- 75 year old male former(minimal) smoker followed for COPD mixed type, allergic rhinitis, complicated by HBP, prostate cancer, Hard of Hearing Ventolin HFA, Nasonex, Breo 200 Covid vax- 2 Phizer Flu vax- scheduled Worse asthma last 2 weekends- unsure why. Not outside much. Says Fall leaves/ season are historically trigger times. Continues Breo 200. Used rescue inhaler 2-3x/ day. Better now. Now obvious infection. No flare rhinitis but eyes are itching.   06/12/21- 75 year old male former(minimal) smoker followed for COPD / Asthmamixed type, allergic rhinitis, complicated by HTN, prostate cancer, Hard of Hearing, Hypercholesterolemia,  -Ventolin HFA, Nasonex, Breo 200 Covid vax- 3 Phizer Flu vax-pending elsewhere -----Pt states no concerns  He feels he is doing well with no particular issues or recent exacerbations since summer.  Breo works well. CXR 06/11/20- IMPRESSION: 1. No acute cardiopulmonary disease. 2. Hyperinflated lungs, compatible with reported obstructive lung disease.   Review of Systems- HPI + = positive Constitutional:   No-   weight loss, night sweats, fevers, chills, fatigue, lassitude. HEENT:   No-  headaches, difficulty swallowing, tooth/dental problems, sore throat,       No- recent sneezing, itching, ear ache, +nasal congestion, post nasal drip,  CV:  No-   chest pain, orthopnea,  PND, swelling in lower extremities, anasarca,   dizziness, palpitations Resp: + shortness of breath with exertion or at rest.              No-   productive cough,  No non-productive cough,  No- coughing up of blood.              No-   change in color of mucus. + rare wheezing.   Skin: No-   rash or lesions. GI:  No reflux GU:  MS:  No-   joint pain or swelling Neuro-     nothing unusual Psych:  No- change in mood or affect. No depression or anxiety.  No memory loss   Objective:   Physical Exam General- Alert, Oriented, Affect-appropriate, Distress- none acute, looks well Skin- rash-none, lesions- none, excoriation- none Lymphadenopathy- none Head- atraumatic            Eyes- Gross vision intact, PERRLA, conjunctivae- not injected            Ears- + hard of hearing            Nose- Clear, no-Septal dev, mucus, polyps, erosion, perforation             Throat- Mallampati II , mucosa clear , drainage- none, tonsils- atrophic Neck- flexible , trachea midline, no stridor , thyroid nl, carotid no bruit Chest - symmetrical excursion , unlabored           Heart/CV- RRR , no murmur , no gallop  , no rub, nl s1 s2                           - JVD- none , edema- none, stasis changes- none, varices- none           Lung- + distant, wheeze- none, cough-  none , dullness-none, rub- none           Chest wall-  Abd-  Br/ Gen/ Rectal- Not done, not indicated Extrem- cyanosis- none, clubbing, none, atrophy- none, strength- nl Neuro- grossly intact to observation

## 2021-06-12 ENCOUNTER — Encounter: Payer: Self-pay | Admitting: Internal Medicine

## 2021-06-12 ENCOUNTER — Other Ambulatory Visit: Payer: Self-pay

## 2021-06-12 ENCOUNTER — Other Ambulatory Visit (HOSPITAL_COMMUNITY): Payer: Self-pay

## 2021-06-12 ENCOUNTER — Ambulatory Visit: Payer: Medicare Other | Admitting: Internal Medicine

## 2021-06-12 DIAGNOSIS — J309 Allergic rhinitis, unspecified: Secondary | ICD-10-CM

## 2021-06-12 DIAGNOSIS — J449 Chronic obstructive pulmonary disease, unspecified: Secondary | ICD-10-CM | POA: Diagnosis not present

## 2021-06-12 DIAGNOSIS — J4489 Other specified chronic obstructive pulmonary disease: Secondary | ICD-10-CM

## 2021-06-12 DIAGNOSIS — H1013 Acute atopic conjunctivitis, bilateral: Secondary | ICD-10-CM | POA: Diagnosis not present

## 2021-06-12 MED ORDER — FLUTICASONE FUROATE-VILANTEROL 100-25 MCG/INH IN AEPB
INHALATION_SPRAY | RESPIRATORY_TRACT | 12 refills | Status: AC
  Filled 2021-06-12: qty 60, 30d supply, fill #0

## 2021-06-12 NOTE — Patient Instructions (Signed)
Breo refilled  Take care,and call if we can help

## 2021-07-05 ENCOUNTER — Other Ambulatory Visit: Payer: Self-pay

## 2021-07-05 ENCOUNTER — Ambulatory Visit: Payer: Medicare Other | Admitting: Dermatology

## 2021-07-05 ENCOUNTER — Encounter: Payer: Self-pay | Admitting: Dermatology

## 2021-07-05 ENCOUNTER — Other Ambulatory Visit (HOSPITAL_COMMUNITY): Payer: Self-pay

## 2021-07-05 DIAGNOSIS — L570 Actinic keratosis: Secondary | ICD-10-CM

## 2021-07-05 DIAGNOSIS — Z85828 Personal history of other malignant neoplasm of skin: Secondary | ICD-10-CM | POA: Diagnosis not present

## 2021-07-05 DIAGNOSIS — Z1283 Encounter for screening for malignant neoplasm of skin: Secondary | ICD-10-CM

## 2021-07-05 DIAGNOSIS — L300 Nummular dermatitis: Secondary | ICD-10-CM

## 2021-07-05 MED ORDER — DESONIDE 0.05 % EX CREA
TOPICAL_CREAM | CUTANEOUS | 3 refills | Status: DC
  Filled 2021-07-05: qty 60, 30d supply, fill #0
  Filled 2021-12-27: qty 60, 30d supply, fill #1
  Filled 2022-04-16: qty 60, 30d supply, fill #2
  Filled 2022-06-07: qty 60, 30d supply, fill #3

## 2021-07-05 MED ORDER — BETAMETHASONE DIPROPIONATE 0.05 % EX CREA
TOPICAL_CREAM | CUTANEOUS | 6 refills | Status: DC
  Filled 2021-07-05: qty 90, 30d supply, fill #0
  Filled 2022-02-27: qty 90, 30d supply, fill #1
  Filled 2022-06-07: qty 90, 30d supply, fill #2

## 2021-07-06 ENCOUNTER — Other Ambulatory Visit (HOSPITAL_COMMUNITY): Payer: Self-pay

## 2021-07-17 ENCOUNTER — Other Ambulatory Visit (HOSPITAL_COMMUNITY): Payer: Self-pay

## 2021-07-17 MED ORDER — FLUTICASONE FUROATE-VILANTEROL 100-25 MCG/ACT IN AEPB
INHALATION_SPRAY | RESPIRATORY_TRACT | 11 refills | Status: DC
  Filled 2021-07-17: qty 60, 30d supply, fill #0
  Filled 2021-08-11: qty 60, 30d supply, fill #1
  Filled 2021-09-19 – 2021-09-20 (×2): qty 60, 30d supply, fill #2
  Filled 2021-10-23: qty 60, 30d supply, fill #3
  Filled 2021-11-21: qty 60, 30d supply, fill #4
  Filled 2021-12-27: qty 60, 30d supply, fill #5
  Filled 2022-01-30: qty 60, 30d supply, fill #6
  Filled 2022-02-27: qty 60, 30d supply, fill #7
  Filled 2022-04-05: qty 60, 30d supply, fill #8
  Filled 2022-05-07: qty 60, 30d supply, fill #9
  Filled 2022-06-06: qty 60, 30d supply, fill #10

## 2021-07-19 ENCOUNTER — Other Ambulatory Visit (HOSPITAL_COMMUNITY): Payer: Self-pay

## 2021-07-22 ENCOUNTER — Encounter: Payer: Self-pay | Admitting: Dermatology

## 2021-07-22 NOTE — Progress Notes (Signed)
   Follow-Up Visit   Subjective  Lucas Armstrong is a 75 y.o. male who presents for the following: Follow-up (Patient here today for follow up no new concerns. ).  Follow-up for skin cancer on chest, check other spots Location:  Duration:  Quality:  Associated Signs/Symptoms: Modifying Factors:  Severity:  Timing: Context:   Objective  Well appearing patient in no apparent distress; mood and affect are within normal limits. 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   Assessment & Plan    AK (actinic keratosis) (4) facial  Keep yearly skin checks   Destruction of lesion - facial Complexity: simple   Destruction method: cryotherapy   Informed consent: discussed and consent obtained   Timeout:  patient name, date of birth, surgical site, and procedure verified Lesion destroyed using liquid nitrogen: Yes   Cryotherapy cycles:  3 Outcome: patient tolerated procedure well with no complications   Post-procedure details: wound care instructions given    Nummular eczema  Related Medications betamethasone dipropionate 0.05 % cream APPLY TO THE AFFECTED AREA ONCE A DAY AS DIRECTED (not for face)  Personal history of skin cancer Right Breast  Recheck as needed change  Encounter for screening for malignant neoplasm of skin  Annual skin examination      I, Lavonna Monarch, MD, have reviewed all documentation for this visit.  The documentation on 07/22/21 for the exam, diagnosis, procedures, and orders are all accurate and complete.

## 2021-08-11 ENCOUNTER — Other Ambulatory Visit (HOSPITAL_COMMUNITY): Payer: Self-pay

## 2021-08-29 ENCOUNTER — Other Ambulatory Visit (HOSPITAL_COMMUNITY): Payer: Self-pay

## 2021-08-31 ENCOUNTER — Other Ambulatory Visit (HOSPITAL_COMMUNITY): Payer: Self-pay

## 2021-08-31 MED ORDER — FLUOROMETHOLONE 0.1 % OP SUSP
OPHTHALMIC | 2 refills | Status: DC
  Filled 2021-08-31: qty 10, 50d supply, fill #0
  Filled 2022-05-07: qty 10, 50d supply, fill #1

## 2021-09-01 ENCOUNTER — Other Ambulatory Visit (HOSPITAL_COMMUNITY): Payer: Self-pay

## 2021-09-01 MED ORDER — FLUOROMETHOLONE 0.1 % OP SUSP
OPHTHALMIC | 2 refills | Status: DC
  Filled 2021-09-01: qty 10, 30d supply, fill #0

## 2021-09-19 DIAGNOSIS — E7800 Pure hypercholesterolemia, unspecified: Secondary | ICD-10-CM | POA: Diagnosis not present

## 2021-09-19 DIAGNOSIS — Z0000 Encounter for general adult medical examination without abnormal findings: Secondary | ICD-10-CM | POA: Diagnosis not present

## 2021-09-19 DIAGNOSIS — J449 Chronic obstructive pulmonary disease, unspecified: Secondary | ICD-10-CM | POA: Diagnosis not present

## 2021-09-19 DIAGNOSIS — I10 Essential (primary) hypertension: Secondary | ICD-10-CM | POA: Diagnosis not present

## 2021-09-20 ENCOUNTER — Other Ambulatory Visit (HOSPITAL_COMMUNITY): Payer: Self-pay

## 2021-09-21 ENCOUNTER — Other Ambulatory Visit (HOSPITAL_COMMUNITY): Payer: Self-pay

## 2021-09-22 DIAGNOSIS — Z86010 Personal history of colonic polyps: Secondary | ICD-10-CM | POA: Diagnosis not present

## 2021-09-27 ENCOUNTER — Encounter: Payer: Self-pay | Admitting: Internal Medicine

## 2021-09-27 NOTE — Assessment & Plan Note (Signed)
We discussed occasional interval use of antihistamines and Flonase as needed.

## 2021-09-27 NOTE — Assessment & Plan Note (Signed)
Uncomplicated now. Plan-Breo refilled.  Medications discussed.

## 2021-10-21 DIAGNOSIS — Z23 Encounter for immunization: Secondary | ICD-10-CM | POA: Diagnosis not present

## 2021-10-23 ENCOUNTER — Other Ambulatory Visit (HOSPITAL_COMMUNITY): Payer: Self-pay

## 2021-11-21 ENCOUNTER — Other Ambulatory Visit (HOSPITAL_COMMUNITY): Payer: Self-pay

## 2021-12-20 DIAGNOSIS — D4989 Neoplasm of unspecified behavior of other specified sites: Secondary | ICD-10-CM | POA: Diagnosis not present

## 2021-12-20 DIAGNOSIS — Z961 Presence of intraocular lens: Secondary | ICD-10-CM | POA: Diagnosis not present

## 2021-12-27 ENCOUNTER — Other Ambulatory Visit (HOSPITAL_COMMUNITY): Payer: Self-pay

## 2021-12-28 ENCOUNTER — Other Ambulatory Visit (HOSPITAL_COMMUNITY): Payer: Self-pay

## 2022-01-22 ENCOUNTER — Other Ambulatory Visit (HOSPITAL_COMMUNITY): Payer: Self-pay

## 2022-01-22 MED ORDER — ATORVASTATIN CALCIUM 40 MG PO TABS
40.0000 mg | ORAL_TABLET | Freq: Every day | ORAL | 1 refills | Status: DC
  Filled 2022-01-22: qty 90, 90d supply, fill #0
  Filled 2022-04-24: qty 90, 90d supply, fill #1

## 2022-01-22 MED ORDER — VALSARTAN-HYDROCHLOROTHIAZIDE 160-12.5 MG PO TABS
ORAL_TABLET | ORAL | 1 refills | Status: DC
  Filled 2022-01-22: qty 90, 90d supply, fill #0
  Filled 2022-04-24: qty 90, 90d supply, fill #1

## 2022-01-30 ENCOUNTER — Other Ambulatory Visit (HOSPITAL_COMMUNITY): Payer: Self-pay

## 2022-02-27 ENCOUNTER — Other Ambulatory Visit (HOSPITAL_COMMUNITY): Payer: Self-pay

## 2022-02-28 ENCOUNTER — Other Ambulatory Visit (HOSPITAL_COMMUNITY): Payer: Self-pay

## 2022-03-21 DIAGNOSIS — J309 Allergic rhinitis, unspecified: Secondary | ICD-10-CM | POA: Diagnosis not present

## 2022-03-21 DIAGNOSIS — J449 Chronic obstructive pulmonary disease, unspecified: Secondary | ICD-10-CM | POA: Diagnosis not present

## 2022-03-21 DIAGNOSIS — I10 Essential (primary) hypertension: Secondary | ICD-10-CM | POA: Diagnosis not present

## 2022-03-21 DIAGNOSIS — E7800 Pure hypercholesterolemia, unspecified: Secondary | ICD-10-CM | POA: Diagnosis not present

## 2022-03-26 DIAGNOSIS — Z8546 Personal history of malignant neoplasm of prostate: Secondary | ICD-10-CM | POA: Diagnosis not present

## 2022-04-05 ENCOUNTER — Other Ambulatory Visit (HOSPITAL_COMMUNITY): Payer: Self-pay

## 2022-04-09 DIAGNOSIS — Z8546 Personal history of malignant neoplasm of prostate: Secondary | ICD-10-CM | POA: Diagnosis not present

## 2022-04-09 DIAGNOSIS — R3915 Urgency of urination: Secondary | ICD-10-CM | POA: Diagnosis not present

## 2022-04-09 DIAGNOSIS — R972 Elevated prostate specific antigen [PSA]: Secondary | ICD-10-CM | POA: Diagnosis not present

## 2022-04-17 ENCOUNTER — Other Ambulatory Visit (HOSPITAL_COMMUNITY): Payer: Self-pay

## 2022-04-24 ENCOUNTER — Other Ambulatory Visit (HOSPITAL_COMMUNITY): Payer: Self-pay

## 2022-05-07 ENCOUNTER — Other Ambulatory Visit (HOSPITAL_COMMUNITY): Payer: Self-pay

## 2022-05-09 ENCOUNTER — Other Ambulatory Visit (HOSPITAL_COMMUNITY): Payer: Self-pay

## 2022-05-09 ENCOUNTER — Other Ambulatory Visit (HOSPITAL_BASED_OUTPATIENT_CLINIC_OR_DEPARTMENT_OTHER): Payer: Self-pay

## 2022-06-06 ENCOUNTER — Other Ambulatory Visit (HOSPITAL_COMMUNITY): Payer: Self-pay

## 2022-06-07 ENCOUNTER — Other Ambulatory Visit (HOSPITAL_COMMUNITY): Payer: Self-pay

## 2022-06-08 ENCOUNTER — Other Ambulatory Visit (HOSPITAL_COMMUNITY): Payer: Self-pay

## 2022-06-10 NOTE — Progress Notes (Unsigned)
Patient ID: Lucas Armstrong, male    DOB: 10-Feb-1946, 76 y.o.   MRN: [id number]  HPI M former(minimal) smoker followed for COPD/asthma, allergic rhinitis. Complicated by HBP, prostate cancer PFT 01/04/14- Moderate obstruction, signif resp to BD, Nl Diffusion Office Spirometry 07/08/2015-severe obstructive airways disease with restricted vital capacity. FVC 2.33/55%, FEV1 1.36/41%, FEV1/FEC 0.58, FEF 25-75 percent 0.45/15% Resp Allergy Profile 06/10/17- broadly elevated -----------------------------------------------------------------------------------------------------------.   06/12/21- 76 year old male former(minimal) smoker followed for COPD / Asthmamixed type, allergic rhinitis, complicated by HTN, prostate cancer, Hard of Hearing, Hypercholesterolemia,  -Ventolin HFA, Nasonex, Breo 200 Covid vax- 3 Phizer Flu vax-pending elsewhere -----Pt states no concerns  He feels he is doing well with no particular issues or recent exacerbations since summer.  Breo works well. CXR 06/11/20- IMPRESSION: 1. No acute cardiopulmonary disease. 2. Hyperinflated lungs, compatible with reported obstructive lung disease.  06/12/22- 76 year old male former(minimal) smoker followed for COPD / Asthma mixed type, allergic rhinitis, complicated by HTN, Prostate Cancer, Hard of Hearing, Hypercholesterolemia,  -Ventolin HFA, Nasonex, Breztri or Breo 100,  Covid vax- 3 Phizer Flu vax-   Review of Systems- HPI + = positive Constitutional:   No-   weight loss, night sweats, fevers, chills, fatigue, lassitude. HEENT:   No-  headaches, difficulty swallowing, tooth/dental problems, sore throat,       No- recent sneezing, itching, ear ache, +nasal congestion, post nasal drip,  CV:  No-   chest pain, orthopnea, PND, swelling in lower extremities, anasarca,   dizziness, palpitations Resp: + shortness of breath with exertion or at rest.              No-   productive cough,  No non-productive cough,  No- coughing up  of blood.              No-   change in color of mucus. + rare wheezing.   Skin: No-   rash or lesions. GI:  No reflux GU:  MS:  No-   joint pain or swelling Neuro-     nothing unusual Psych:  No- change in mood or affect. No depression or anxiety.  No memory loss   Objective:   Physical Exam General- Alert, Oriented, Affect-appropriate, Distress- none acute, looks well Skin- rash-none, lesions- none, excoriation- none Lymphadenopathy- none Head- atraumatic            Eyes- Gross vision intact, PERRLA, conjunctivae- not injected            Ears- + hard of hearing            Nose- Clear, no-Septal dev, mucus, polyps, erosion, perforation             Throat- Mallampati II , mucosa clear , drainage- none, tonsils- atrophic Neck- flexible , trachea midline, no stridor , thyroid nl, carotid no bruit Chest - symmetrical excursion , unlabored           Heart/CV- RRR , no murmur , no gallop  , no rub, nl s1 s2                           - JVD- none , edema- none, stasis changes- none, varices- none           Lung- + distant, wheeze- none, cough- none , dullness-none, rub- none           Chest wall-  Abd-  Br/ Gen/ Rectal- Not done, not indicated Extrem- cyanosis- none, clubbing, none,  atrophy- none, strength- nl Neuro- grossly intact to observation

## 2022-06-12 ENCOUNTER — Other Ambulatory Visit (HOSPITAL_COMMUNITY): Payer: Self-pay

## 2022-06-12 ENCOUNTER — Encounter: Payer: Self-pay | Admitting: Internal Medicine

## 2022-06-12 ENCOUNTER — Ambulatory Visit: Payer: Medicare Other | Admitting: Internal Medicine

## 2022-06-12 DIAGNOSIS — H1013 Acute atopic conjunctivitis, bilateral: Secondary | ICD-10-CM | POA: Diagnosis not present

## 2022-06-12 DIAGNOSIS — J4489 Other specified chronic obstructive pulmonary disease: Secondary | ICD-10-CM

## 2022-06-12 DIAGNOSIS — J309 Allergic rhinitis, unspecified: Secondary | ICD-10-CM

## 2022-06-12 MED ORDER — ALBUTEROL SULFATE HFA 108 (90 BASE) MCG/ACT IN AERS
2.0000 | INHALATION_SPRAY | RESPIRATORY_TRACT | 11 refills | Status: DC | PRN
Start: 2022-06-12 — End: 2023-07-22
  Filled 2022-06-12 – 2023-04-25 (×2): qty 18, 16d supply, fill #0

## 2022-06-12 MED ORDER — FLUTICASONE FUROATE-VILANTEROL 100-25 MCG/ACT IN AEPB
INHALATION_SPRAY | RESPIRATORY_TRACT | 11 refills | Status: DC
  Filled 2022-06-12: qty 60, fill #0
  Filled 2022-07-11: qty 60, 30d supply, fill #0
  Filled 2022-08-14: qty 60, 30d supply, fill #1
  Filled 2022-09-14: qty 60, 30d supply, fill #2
  Filled 2022-10-15: qty 60, 30d supply, fill #3
  Filled 2022-11-14: qty 60, 30d supply, fill #4
  Filled 2022-12-18: qty 60, 30d supply, fill #5
  Filled 2023-01-16: qty 60, 30d supply, fill #6
  Filled 2023-02-25: qty 60, 30d supply, fill #7
  Filled 2023-03-26: qty 60, 30d supply, fill #8
  Filled 2023-04-30: qty 60, 30d supply, fill #9
  Filled 2023-05-28: qty 60, 30d supply, fill #10

## 2022-06-12 NOTE — Patient Instructions (Signed)
Refills sent to keep current your scripts for Ventolin and Breo.  Potential alternative antihistamines to compare with Claritin-   Allegra, Zyrtec  Please call if we can help

## 2022-06-13 ENCOUNTER — Encounter: Payer: Self-pay | Admitting: Internal Medicine

## 2022-06-13 NOTE — Assessment & Plan Note (Signed)
Seasonal and perennial.  Primarily atopic. Plan-continue Flonase.  Alternatives to Claritin might include Zyrtec and Allegra for his consideration if he decides to try.

## 2022-06-13 NOTE — Assessment & Plan Note (Signed)
Good stability and comfortable control with Breo. Plan-refill albuterol rescue inhaler and Breo.  No changes needed.

## 2022-06-22 ENCOUNTER — Other Ambulatory Visit (HOSPITAL_COMMUNITY): Payer: Self-pay

## 2022-06-30 DIAGNOSIS — Z23 Encounter for immunization: Secondary | ICD-10-CM | POA: Diagnosis not present

## 2022-07-09 ENCOUNTER — Ambulatory Visit: Payer: Medicare Other | Admitting: Dermatology

## 2022-07-11 ENCOUNTER — Other Ambulatory Visit (HOSPITAL_COMMUNITY): Payer: Self-pay

## 2022-07-18 DIAGNOSIS — D4989 Neoplasm of unspecified behavior of other specified sites: Secondary | ICD-10-CM | POA: Diagnosis not present

## 2022-07-18 DIAGNOSIS — Z961 Presence of intraocular lens: Secondary | ICD-10-CM | POA: Diagnosis not present

## 2022-07-24 ENCOUNTER — Other Ambulatory Visit (HOSPITAL_COMMUNITY): Payer: Self-pay

## 2022-07-24 MED ORDER — VALSARTAN-HYDROCHLOROTHIAZIDE 160-12.5 MG PO TABS
1.0000 | ORAL_TABLET | Freq: Every day | ORAL | 0 refills | Status: DC
  Filled 2022-07-24: qty 90, 90d supply, fill #0

## 2022-07-24 MED ORDER — ATORVASTATIN CALCIUM 40 MG PO TABS
40.0000 mg | ORAL_TABLET | Freq: Every day | ORAL | 0 refills | Status: DC
  Filled 2022-07-24: qty 90, 90d supply, fill #0

## 2022-07-25 ENCOUNTER — Other Ambulatory Visit (HOSPITAL_COMMUNITY): Payer: Self-pay

## 2022-08-14 ENCOUNTER — Other Ambulatory Visit (HOSPITAL_COMMUNITY): Payer: Self-pay

## 2022-09-14 ENCOUNTER — Other Ambulatory Visit (HOSPITAL_COMMUNITY): Payer: Self-pay

## 2022-09-17 ENCOUNTER — Other Ambulatory Visit (HOSPITAL_COMMUNITY): Payer: Self-pay

## 2022-10-17 DIAGNOSIS — J309 Allergic rhinitis, unspecified: Secondary | ICD-10-CM | POA: Diagnosis not present

## 2022-10-17 DIAGNOSIS — E7800 Pure hypercholesterolemia, unspecified: Secondary | ICD-10-CM | POA: Diagnosis not present

## 2022-10-17 DIAGNOSIS — Z0000 Encounter for general adult medical examination without abnormal findings: Secondary | ICD-10-CM | POA: Diagnosis not present

## 2022-10-17 DIAGNOSIS — J449 Chronic obstructive pulmonary disease, unspecified: Secondary | ICD-10-CM | POA: Diagnosis not present

## 2022-10-17 DIAGNOSIS — I10 Essential (primary) hypertension: Secondary | ICD-10-CM | POA: Diagnosis not present

## 2022-10-29 ENCOUNTER — Other Ambulatory Visit (HOSPITAL_COMMUNITY): Payer: Self-pay

## 2022-10-29 MED ORDER — VALSARTAN-HYDROCHLOROTHIAZIDE 160-12.5 MG PO TABS
1.0000 | ORAL_TABLET | Freq: Every day | ORAL | 1 refills | Status: DC
  Filled 2022-10-29: qty 90, 90d supply, fill #0
  Filled 2023-01-28: qty 90, 90d supply, fill #1

## 2022-11-06 ENCOUNTER — Other Ambulatory Visit (HOSPITAL_COMMUNITY): Payer: Self-pay

## 2022-11-07 ENCOUNTER — Other Ambulatory Visit (HOSPITAL_COMMUNITY): Payer: Self-pay

## 2022-11-07 MED ORDER — ATORVASTATIN CALCIUM 40 MG PO TABS
40.0000 mg | ORAL_TABLET | Freq: Every day | ORAL | 1 refills | Status: DC
  Filled 2022-11-07: qty 90, 90d supply, fill #0
  Filled 2023-01-28: qty 90, 90d supply, fill #1

## 2022-11-14 ENCOUNTER — Other Ambulatory Visit (HOSPITAL_COMMUNITY): Payer: Self-pay

## 2022-11-14 ENCOUNTER — Other Ambulatory Visit: Payer: Self-pay

## 2022-11-19 ENCOUNTER — Other Ambulatory Visit (HOSPITAL_COMMUNITY): Payer: Self-pay

## 2022-12-18 ENCOUNTER — Other Ambulatory Visit (HOSPITAL_COMMUNITY): Payer: Self-pay

## 2022-12-18 MED ORDER — FLUOROMETHOLONE 0.1 % OP SUSP
1.0000 [drp] | Freq: Two times a day (BID) | OPHTHALMIC | 6 refills | Status: DC
  Filled 2022-12-18: qty 10, 40d supply, fill #0

## 2023-01-07 ENCOUNTER — Other Ambulatory Visit (HOSPITAL_COMMUNITY): Payer: Self-pay

## 2023-01-16 ENCOUNTER — Other Ambulatory Visit (HOSPITAL_COMMUNITY): Payer: Self-pay

## 2023-01-29 DIAGNOSIS — H6123 Impacted cerumen, bilateral: Secondary | ICD-10-CM | POA: Diagnosis not present

## 2023-01-30 ENCOUNTER — Other Ambulatory Visit (HOSPITAL_COMMUNITY): Payer: Self-pay

## 2023-04-03 DIAGNOSIS — R9721 Rising PSA following treatment for malignant neoplasm of prostate: Secondary | ICD-10-CM | POA: Diagnosis not present

## 2023-04-10 DIAGNOSIS — Z961 Presence of intraocular lens: Secondary | ICD-10-CM | POA: Diagnosis not present

## 2023-04-10 DIAGNOSIS — D4989 Neoplasm of unspecified behavior of other specified sites: Secondary | ICD-10-CM | POA: Diagnosis not present

## 2023-04-10 DIAGNOSIS — R351 Nocturia: Secondary | ICD-10-CM | POA: Diagnosis not present

## 2023-04-10 DIAGNOSIS — Z8546 Personal history of malignant neoplasm of prostate: Secondary | ICD-10-CM | POA: Diagnosis not present

## 2023-04-17 DIAGNOSIS — J449 Chronic obstructive pulmonary disease, unspecified: Secondary | ICD-10-CM | POA: Diagnosis not present

## 2023-04-17 DIAGNOSIS — E7800 Pure hypercholesterolemia, unspecified: Secondary | ICD-10-CM | POA: Diagnosis not present

## 2023-04-17 DIAGNOSIS — I10 Essential (primary) hypertension: Secondary | ICD-10-CM | POA: Diagnosis not present

## 2023-04-17 DIAGNOSIS — D485 Neoplasm of uncertain behavior of skin: Secondary | ICD-10-CM | POA: Diagnosis not present

## 2023-04-25 ENCOUNTER — Other Ambulatory Visit (HOSPITAL_COMMUNITY): Payer: Self-pay

## 2023-04-25 MED ORDER — VALSARTAN-HYDROCHLOROTHIAZIDE 160-12.5 MG PO TABS
1.0000 | ORAL_TABLET | Freq: Every day | ORAL | 2 refills | Status: DC
  Filled 2023-04-25: qty 90, 90d supply, fill #0
  Filled 2023-07-29: qty 90, 90d supply, fill #1
  Filled 2023-10-28: qty 90, 90d supply, fill #2

## 2023-04-25 MED ORDER — ATORVASTATIN CALCIUM 40 MG PO TABS
40.0000 mg | ORAL_TABLET | Freq: Every day | ORAL | 2 refills | Status: DC
  Filled 2023-04-25: qty 90, 90d supply, fill #0
  Filled 2023-07-29: qty 90, 90d supply, fill #1
  Filled 2023-10-28: qty 90, 90d supply, fill #2

## 2023-05-28 ENCOUNTER — Other Ambulatory Visit (HOSPITAL_COMMUNITY): Payer: Self-pay

## 2023-05-29 ENCOUNTER — Other Ambulatory Visit (HOSPITAL_COMMUNITY): Payer: Self-pay

## 2023-06-10 ENCOUNTER — Other Ambulatory Visit (HOSPITAL_COMMUNITY): Payer: Self-pay

## 2023-06-12 NOTE — Progress Notes (Signed)
Patient ID: Lucas Armstrong, male    DOB: Oct 14, 1945, 77 y.o.   MRN: [id number]  HPI M former(minimal) smoker followed for COPD/asthma, allergic rhinitis. Complicated by HBP, prostate cancer PFT 01/04/14- Moderate obstruction, signif resp to BD, Nl Diffusion Office Spirometry 07/08/2015-severe obstructive airways disease with restricted vital capacity. FVC 2.33/55%, FEV1 1.36/41%, FEV1/FEC 0.58, FEF 25-75 percent 0.45/15% Resp Allergy Profile 06/10/17- broadly elevated -----------------------------------------------------------------------------------------------------------.   06/12/22- 77 year old male former(minimal) smoker followed for COPD / Asthma mixed type, allergic rhinitis, complicated by HTN, Prostate Cancer, Hard of Hearing, Hypercholesterolemia,  -Ventolin HFA, Nasonex,  Breo 100,  Covid vax- 3 Phizer Flu vax- pending few weeks -----Pt f/u breathing has been good, inhalers are working well. He has no concerns. Pleased with maintenance control by his Breo used daily.  Only needing rescue inhaler very occasionally.  Some recent increase in seasonal allergic itching of eyes watering of nose.  Less trouble with allergies problems in the fall since he stopped doing his own yard work.  Continues Flonase but asks about alternative antihistamines.  06/13/23- 77 year old male former(minimal) smoker followed for COPD / Asthma mixed type, allergic rhinitis, complicated by HTN, Prostate Cancer, Hard of Hearing, Hypercholesterolemia,  -Ventolin HFA, Nasonex,  Breo 100,  ------Breathing has been good ACT 24  Flu vaccine pending next week.  Says his breathing is fine.  Rarely needs rescue inhaler but does continue daily Breo.  No longer does his own yard work and that change made a big improvement in fall seasonal respiratory complaints.  Review of Systems- HPI + = positive Constitutional:   No-   weight loss, night sweats, fevers, chills, fatigue, lassitude. HEENT:   No-  headaches,  difficulty swallowing, tooth/dental problems, sore throat,       No- recent sneezing, itching, ear ache, +nasal congestion, post nasal drip,  CV:  No-   chest pain, orthopnea, PND, swelling in lower extremities, anasarca,   dizziness, palpitations Resp: + shortness of breath with exertion or at rest.              No-   productive cough,  No non-productive cough,  No- coughing up of blood.              No-   change in color of mucus. + rare wheezing.   Skin: No-   rash or lesions. GI:  No reflux GU:  MS:  No-   joint pain or swelling Neuro-     nothing unusual Psych:  No- change in mood or affect. No depression or anxiety.  No memory loss   Objective:   Physical Exam General- Alert, Oriented, Affect-appropriate, Distress- none acute, looks well Skin- +2 excoriated spots on his face that I asked him to get looked at by Dermatology. Lymphadenopathy- none Head- atraumatic            Eyes- Gross vision intact, PERRLA, conjunctivae- not injected            Ears- + hard of hearing            Nose- Clear, no-Septal dev, mucus, polyps, erosion, perforation             Throat- Mallampati II , mucosa clear , drainage- none, tonsils- atrophic Neck- flexible , trachea midline, no stridor , thyroid nl, carotid no bruit Chest - symmetrical excursion , unlabored           Heart/CV- RRR , no murmur , no gallop  , no rub, nl s1 s2                           -  JVD- none , edema- none, stasis changes- none, varices- none           Lung- + distant, wheeze- none, cough- none , dullness-none, rub- none           Chest wall-  Abd-  Br/ Gen/ Rectal- Not done, not indicated Extrem- cyanosis- none, clubbing, none, atrophy- none, strength- nl Neuro- grossly intact to observation

## 2023-06-13 ENCOUNTER — Ambulatory Visit: Payer: Medicare Other | Admitting: Internal Medicine

## 2023-06-13 ENCOUNTER — Encounter: Payer: Self-pay | Admitting: Internal Medicine

## 2023-06-13 ENCOUNTER — Other Ambulatory Visit (HOSPITAL_COMMUNITY): Payer: Self-pay

## 2023-06-13 VITALS — BP 153/83 | HR 89 | Ht 68.0 in | Wt 159.0 lb

## 2023-06-13 DIAGNOSIS — J309 Allergic rhinitis, unspecified: Secondary | ICD-10-CM | POA: Diagnosis not present

## 2023-06-13 DIAGNOSIS — H1013 Acute atopic conjunctivitis, bilateral: Secondary | ICD-10-CM | POA: Diagnosis not present

## 2023-06-13 DIAGNOSIS — J4489 Other specified chronic obstructive pulmonary disease: Secondary | ICD-10-CM

## 2023-06-13 NOTE — Patient Instructions (Addendum)
Glad you are doing well. Please call if we can help 

## 2023-06-14 ENCOUNTER — Other Ambulatory Visit (HOSPITAL_COMMUNITY): Payer: Self-pay

## 2023-06-14 MED ORDER — FLUOROMETHOLONE 0.1 % OP SUSP
1.0000 [drp] | Freq: Two times a day (BID) | OPHTHALMIC | 6 refills | Status: DC
  Filled 2023-06-14: qty 10, 50d supply, fill #0
  Filled 2023-12-23: qty 10, 50d supply, fill #1
  Filled 2024-05-26: qty 10, 50d supply, fill #2

## 2023-06-14 MED ORDER — FLUOROMETHOLONE 0.1 % OP SUSP
1.0000 [drp] | Freq: Two times a day (BID) | OPHTHALMIC | 6 refills | Status: AC
  Filled 2023-06-14: qty 10, 50d supply, fill #0

## 2023-06-18 ENCOUNTER — Other Ambulatory Visit (HOSPITAL_COMMUNITY): Payer: Self-pay

## 2023-06-19 ENCOUNTER — Encounter: Payer: Self-pay | Admitting: Internal Medicine

## 2023-06-19 NOTE — Assessment & Plan Note (Signed)
He is comfortable and doing well with current therapy.  No need for changes.  There is significant allergic component in his history but he currently does not need to add a Biologic agent.

## 2023-06-19 NOTE — Assessment & Plan Note (Signed)
Atopic but usually managed with Flonase and antihistamines.

## 2023-06-22 DIAGNOSIS — Z23 Encounter for immunization: Secondary | ICD-10-CM | POA: Diagnosis not present

## 2023-07-03 ENCOUNTER — Other Ambulatory Visit: Payer: Self-pay | Admitting: Internal Medicine

## 2023-07-05 ENCOUNTER — Other Ambulatory Visit (HOSPITAL_COMMUNITY): Payer: Self-pay

## 2023-07-05 ENCOUNTER — Telehealth: Payer: Self-pay | Admitting: Internal Medicine

## 2023-07-05 MED ORDER — FLUTICASONE FUROATE-VILANTEROL 100-25 MCG/ACT IN AEPB
1.0000 | INHALATION_SPRAY | Freq: Every day | RESPIRATORY_TRACT | 11 refills | Status: DC
  Filled 2023-07-05: qty 60, 30d supply, fill #0
  Filled 2023-08-02: qty 60, 30d supply, fill #1
  Filled 2023-09-02: qty 60, 30d supply, fill #2
  Filled 2023-10-02: qty 60, 30d supply, fill #3
  Filled 2023-10-28: qty 60, 30d supply, fill #4
  Filled 2023-12-03: qty 60, 30d supply, fill #5
  Filled 2024-01-01: qty 60, 30d supply, fill #6
  Filled 2024-02-05: qty 60, 30d supply, fill #7
  Filled 2024-03-10: qty 60, 30d supply, fill #8
  Filled 2024-04-09: qty 60, 30d supply, fill #9
  Filled 2024-05-13: qty 60, 30d supply, fill #10
  Filled 2024-06-09: qty 60, 30d supply, fill #11

## 2023-07-05 NOTE — Telephone Encounter (Signed)
PT states Pharm has not hear from [place] on his Breo order. Please send in:  [facility].  He called Pharm Wed AM.   [phone]

## 2023-07-05 NOTE — Telephone Encounter (Signed)
Refilled lvm

## 2023-07-06 ENCOUNTER — Other Ambulatory Visit (HOSPITAL_COMMUNITY): Payer: Self-pay

## 2023-07-22 ENCOUNTER — Other Ambulatory Visit: Payer: Self-pay | Admitting: Internal Medicine

## 2023-07-22 ENCOUNTER — Other Ambulatory Visit (HOSPITAL_COMMUNITY): Payer: Self-pay

## 2023-07-22 MED ORDER — ALBUTEROL SULFATE HFA 108 (90 BASE) MCG/ACT IN AERS
2.0000 | INHALATION_SPRAY | RESPIRATORY_TRACT | 11 refills | Status: AC | PRN
  Filled 2023-07-22: qty 18, 16d supply, fill #0
  Filled 2024-01-01: qty 18, 16d supply, fill #1
  Filled 2024-04-27: qty 18, 16d supply, fill #2
  Filled 2024-06-15: qty 18, 16d supply, fill #3

## 2023-07-24 ENCOUNTER — Other Ambulatory Visit (HOSPITAL_COMMUNITY): Payer: Self-pay

## 2023-07-24 DIAGNOSIS — L570 Actinic keratosis: Secondary | ICD-10-CM | POA: Diagnosis not present

## 2023-07-24 DIAGNOSIS — L309 Dermatitis, unspecified: Secondary | ICD-10-CM | POA: Diagnosis not present

## 2023-07-24 MED ORDER — FLUOROURACIL 5 % EX CREA
TOPICAL_CREAM | CUTANEOUS | 0 refills | Status: DC
  Filled 2023-07-24: qty 40, 28d supply, fill #0

## 2023-07-24 MED ORDER — CLOBETASOL PROPIONATE 0.05 % EX SHAM
MEDICATED_SHAMPOO | CUTANEOUS | 1 refills | Status: DC
  Filled 2023-07-24: qty 118, 30d supply, fill #0
  Filled 2024-01-09: qty 118, 30d supply, fill #1

## 2023-07-30 ENCOUNTER — Other Ambulatory Visit (HOSPITAL_COMMUNITY): Payer: Self-pay

## 2023-08-02 ENCOUNTER — Other Ambulatory Visit (HOSPITAL_COMMUNITY): Payer: Self-pay

## 2023-08-05 ENCOUNTER — Other Ambulatory Visit (HOSPITAL_COMMUNITY): Payer: Self-pay

## 2023-09-02 ENCOUNTER — Other Ambulatory Visit (HOSPITAL_COMMUNITY): Payer: Self-pay

## 2023-10-03 ENCOUNTER — Other Ambulatory Visit (HOSPITAL_COMMUNITY): Payer: Self-pay

## 2023-10-09 DIAGNOSIS — D229 Melanocytic nevi, unspecified: Secondary | ICD-10-CM | POA: Diagnosis not present

## 2023-10-09 DIAGNOSIS — L814 Other melanin hyperpigmentation: Secondary | ICD-10-CM | POA: Diagnosis not present

## 2023-10-09 DIAGNOSIS — C44519 Basal cell carcinoma of skin of other part of trunk: Secondary | ICD-10-CM | POA: Diagnosis not present

## 2023-10-09 DIAGNOSIS — L570 Actinic keratosis: Secondary | ICD-10-CM | POA: Diagnosis not present

## 2023-10-09 DIAGNOSIS — L821 Other seborrheic keratosis: Secondary | ICD-10-CM | POA: Diagnosis not present

## 2023-10-09 DIAGNOSIS — D485 Neoplasm of uncertain behavior of skin: Secondary | ICD-10-CM | POA: Diagnosis not present

## 2023-10-09 DIAGNOSIS — L578 Other skin changes due to chronic exposure to nonionizing radiation: Secondary | ICD-10-CM | POA: Diagnosis not present

## 2023-10-16 DIAGNOSIS — D4989 Neoplasm of unspecified behavior of other specified sites: Secondary | ICD-10-CM | POA: Diagnosis not present

## 2023-10-16 DIAGNOSIS — Z961 Presence of intraocular lens: Secondary | ICD-10-CM | POA: Diagnosis not present

## 2023-10-23 DIAGNOSIS — I10 Essential (primary) hypertension: Secondary | ICD-10-CM | POA: Diagnosis not present

## 2023-10-23 DIAGNOSIS — Z0000 Encounter for general adult medical examination without abnormal findings: Secondary | ICD-10-CM | POA: Diagnosis not present

## 2023-10-23 DIAGNOSIS — E7800 Pure hypercholesterolemia, unspecified: Secondary | ICD-10-CM | POA: Diagnosis not present

## 2023-10-29 ENCOUNTER — Other Ambulatory Visit (HOSPITAL_COMMUNITY): Payer: Self-pay

## 2023-10-29 DIAGNOSIS — Z1211 Encounter for screening for malignant neoplasm of colon: Secondary | ICD-10-CM | POA: Diagnosis not present

## 2023-11-04 DIAGNOSIS — L570 Actinic keratosis: Secondary | ICD-10-CM | POA: Diagnosis not present

## 2023-11-21 DIAGNOSIS — C44519 Basal cell carcinoma of skin of other part of trunk: Secondary | ICD-10-CM | POA: Diagnosis not present

## 2023-11-21 DIAGNOSIS — C4491 Basal cell carcinoma of skin, unspecified: Secondary | ICD-10-CM | POA: Diagnosis not present

## 2023-12-23 ENCOUNTER — Other Ambulatory Visit (HOSPITAL_COMMUNITY): Payer: Self-pay

## 2023-12-23 ENCOUNTER — Other Ambulatory Visit: Payer: Self-pay

## 2024-01-08 DIAGNOSIS — L570 Actinic keratosis: Secondary | ICD-10-CM | POA: Diagnosis not present

## 2024-01-28 ENCOUNTER — Other Ambulatory Visit (HOSPITAL_COMMUNITY): Payer: Self-pay

## 2024-01-28 MED ORDER — VALSARTAN-HYDROCHLOROTHIAZIDE 160-12.5 MG PO TABS
ORAL_TABLET | Freq: Every day | ORAL | 1 refills | Status: DC
  Filled 2024-01-28: qty 90, 90d supply, fill #0
  Filled 2024-04-27: qty 90, 90d supply, fill #1
  Filled 2024-07-27: qty 90, 90d supply, fill #2

## 2024-01-28 MED ORDER — ATORVASTATIN CALCIUM 40 MG PO TABS
40.0000 mg | ORAL_TABLET | Freq: Every day | ORAL | 1 refills | Status: DC
Start: 2024-01-28 — End: 2024-07-27
  Filled 2024-01-28: qty 90, 90d supply, fill #0
  Filled 2024-04-27: qty 90, 90d supply, fill #1
  Filled 2024-07-27: qty 90, 90d supply, fill #2

## 2024-02-05 ENCOUNTER — Other Ambulatory Visit (HOSPITAL_COMMUNITY): Payer: Self-pay

## 2024-03-05 ENCOUNTER — Other Ambulatory Visit (HOSPITAL_COMMUNITY): Payer: Self-pay

## 2024-04-22 DIAGNOSIS — J449 Chronic obstructive pulmonary disease, unspecified: Secondary | ICD-10-CM | POA: Diagnosis not present

## 2024-04-22 DIAGNOSIS — I10 Essential (primary) hypertension: Secondary | ICD-10-CM | POA: Diagnosis not present

## 2024-04-22 DIAGNOSIS — E7800 Pure hypercholesterolemia, unspecified: Secondary | ICD-10-CM | POA: Diagnosis not present

## 2024-04-22 DIAGNOSIS — J309 Allergic rhinitis, unspecified: Secondary | ICD-10-CM | POA: Diagnosis not present

## 2024-04-27 ENCOUNTER — Other Ambulatory Visit (HOSPITAL_COMMUNITY): Payer: Self-pay

## 2024-05-04 DIAGNOSIS — L578 Other skin changes due to chronic exposure to nonionizing radiation: Secondary | ICD-10-CM | POA: Diagnosis not present

## 2024-05-04 DIAGNOSIS — L570 Actinic keratosis: Secondary | ICD-10-CM | POA: Diagnosis not present

## 2024-05-04 DIAGNOSIS — L821 Other seborrheic keratosis: Secondary | ICD-10-CM | POA: Diagnosis not present

## 2024-05-04 DIAGNOSIS — L814 Other melanin hyperpigmentation: Secondary | ICD-10-CM | POA: Diagnosis not present

## 2024-05-04 DIAGNOSIS — D229 Melanocytic nevi, unspecified: Secondary | ICD-10-CM | POA: Diagnosis not present

## 2024-05-20 ENCOUNTER — Other Ambulatory Visit (HOSPITAL_COMMUNITY): Payer: Self-pay

## 2024-05-20 ENCOUNTER — Other Ambulatory Visit: Payer: Self-pay

## 2024-05-20 MED ORDER — CLOBETASOL PROPIONATE 0.05 % EX SHAM
MEDICATED_SHAMPOO | CUTANEOUS | 1 refills | Status: AC
  Filled 2024-05-20: qty 118, 15d supply, fill #0

## 2024-05-22 DIAGNOSIS — Z961 Presence of intraocular lens: Secondary | ICD-10-CM | POA: Diagnosis not present

## 2024-05-22 DIAGNOSIS — D4989 Neoplasm of unspecified behavior of other specified sites: Secondary | ICD-10-CM | POA: Diagnosis not present

## 2024-05-26 ENCOUNTER — Other Ambulatory Visit (HOSPITAL_COMMUNITY): Payer: Self-pay

## 2024-06-12 ENCOUNTER — Ambulatory Visit: Payer: Medicare Other | Admitting: Internal Medicine

## 2024-06-15 NOTE — Progress Notes (Signed)
 Patient ID: Lucas Armstrong, male    DOB: 09-01-1946, 78 y.o.   MRN: [id number]  HPI M former(minimal) smoker followed for COPD/asthma, allergic rhinitis. Complicated by HBP, prostate cancer PFT 01/04/14- Moderate obstruction, signif resp to BD, Nl Diffusion Office Spirometry 07/08/2015-severe obstructive airways disease with restricted vital capacity. FVC 2.33/55%, FEV1 1.36/41%, FEV1/FEC 0.58, FEF 25-75 percent 0.45/15% Resp Allergy  Profile 06/10/17- broadly elevated -----------------------------------------------------------------------------------------------------------.  06/13/23- 78 year old male former(minimal) smoker followed for COPD / Asthma mixed type, allergic rhinitis, complicated by HTN, Prostate Cancer, Hard of Hearing, Hypercholesterolemia,  -Ventolin  HFA, Nasonex ,  Breo 100,  ------Breathing has been good ACT 24  Flu vaccine pending next week.  Says his breathing is fine.  Rarely needs rescue inhaler but does continue daily Breo.  No longer does his own yard work and that change made a big improvement in fall seasonal respiratory complaints.  06/16/24- 79 year old male former(minimal) smoker followed for COPD / Asthma mixed type, allergic rhinitis, complicated by HTN, Prostate Cancer, Hard of Hearing, Hypercholesterolemia,  -Ventolin  HFA, Nasonex ,  Breo 100,  -----Some flares due to cleaning office and fall pollen season. Discussed the use of AI scribe software for clinical note transcription with the patient, who gave verbal consent to proceed.  History of Present Illness   Lucas Armstrong is a 78 year old male who presents for follow-up of respiratory symptoms.  He experienced wheezing since September after disturbing dust that had not been moved in twenty years, but the wheezing has since subsided. He uses Breo as a maintenance inhaler and albuterol  as needed, sometimes up to four times a day, particularly around one in the afternoon and before bed. He recently refilled  these medications and does not require additional refills at this time.     Assessment and Plan:    Asthma Asthma exacerbation resolved. Maintenance therapy with Breo adequate. - Continue Breo for maintenance. - Use albuterol  as needed. - Follow-up in one year unless symptoms worsen.   Allergic Rhinitis      Review of Systems- HPI + = positive Constitutional:   No-   weight loss, night sweats, fevers, chills, fatigue, lassitude. HEENT:   No-  headaches, difficulty swallowing, tooth/dental problems, sore throat,       No- recent sneezing, itching, ear ache, +nasal congestion, post nasal drip,  CV:  No-   chest pain, orthopnea, PND, swelling in lower extremities, anasarca,   dizziness, palpitations Resp: + shortness of breath with exertion or at rest.              No-   productive cough,  No non-productive cough,  No- coughing up of blood.              No-   change in color of mucus. + rare wheezing.   Skin: No-   rash or lesions. GI:  No reflux GU:  MS:  No-   joint pain or swelling Neuro-     nothing unusual Psych:  No- change in mood or affect. No depression or anxiety.  No memory loss    Objective:   Physical Exam General- Alert, Oriented, Affect-appropriate, Distress- none acute, looks well Skin- +2 excoriated spots on his face that I asked him to get looked at by Dermatology. Lymphadenopathy- none Head- atraumatic            Eyes- Gross vision intact, PERRLA, conjunctivae- not injected            Ears- + hard of hearing  Nose- Clear, no-Septal dev, mucus, polyps, erosion, perforation             Throat- Mallampati II , mucosa clear , drainage- none, tonsils- atrophic Neck- flexible , trachea midline, no stridor , thyroid nl, carotid no bruit Chest - symmetrical excursion , unlabored           Heart/CV- RRR , no murmur , no gallop  , no rub, nl s1 s2                           - JVD- none , edema- none, stasis changes- none, varices- none           Lung- +  distant,  wheeze- none, cough- none , dullness-none, rub- none           Chest wall-  Abd-  Br/ Gen/ Rectal- Not done, not indicated Extrem- cyanosis- none, clubbing, none, atrophy- none, strength- nl Neuro- grossly intact to observation

## 2024-06-16 ENCOUNTER — Ambulatory Visit: Admitting: Internal Medicine

## 2024-06-16 ENCOUNTER — Encounter: Payer: Self-pay | Admitting: Internal Medicine

## 2024-06-16 VITALS — BP 138/84 | HR 95 | Temp 98.4°F | Ht 68.0 in | Wt 160.6 lb

## 2024-06-16 DIAGNOSIS — Z87891 Personal history of nicotine dependence: Secondary | ICD-10-CM | POA: Diagnosis not present

## 2024-06-16 DIAGNOSIS — J4489 Other specified chronic obstructive pulmonary disease: Secondary | ICD-10-CM

## 2024-06-16 DIAGNOSIS — J45909 Unspecified asthma, uncomplicated: Secondary | ICD-10-CM | POA: Diagnosis not present

## 2024-06-16 NOTE — Patient Instructions (Signed)
 Ok to continue current inhalers. Please let us  know if we can help.

## 2024-06-27 DIAGNOSIS — Z23 Encounter for immunization: Secondary | ICD-10-CM | POA: Diagnosis not present

## 2024-07-13 ENCOUNTER — Other Ambulatory Visit (HOSPITAL_COMMUNITY): Payer: Self-pay

## 2024-07-13 ENCOUNTER — Other Ambulatory Visit: Payer: Self-pay | Admitting: Internal Medicine

## 2024-07-13 ENCOUNTER — Other Ambulatory Visit: Payer: Self-pay

## 2024-07-13 MED ORDER — FLUTICASONE FUROATE-VILANTEROL 100-25 MCG/ACT IN AEPB
1.0000 | INHALATION_SPRAY | Freq: Every day | RESPIRATORY_TRACT | 11 refills | Status: AC
  Filled 2024-07-13: qty 60, 30d supply, fill #0
  Filled 2024-08-11: qty 60, 30d supply, fill #1
  Filled 2024-09-16: qty 60, 30d supply, fill #2

## 2024-07-14 ENCOUNTER — Other Ambulatory Visit (HOSPITAL_COMMUNITY): Payer: Self-pay

## 2024-07-14 DIAGNOSIS — R9721 Rising PSA following treatment for malignant neoplasm of prostate: Secondary | ICD-10-CM | POA: Diagnosis not present

## 2024-07-22 DIAGNOSIS — R9721 Rising PSA following treatment for malignant neoplasm of prostate: Secondary | ICD-10-CM | POA: Diagnosis not present

## 2024-07-22 DIAGNOSIS — R3915 Urgency of urination: Secondary | ICD-10-CM | POA: Diagnosis not present

## 2024-07-27 ENCOUNTER — Other Ambulatory Visit (HOSPITAL_COMMUNITY): Payer: Self-pay

## 2024-07-27 ENCOUNTER — Other Ambulatory Visit (HOSPITAL_COMMUNITY): Payer: Self-pay | Admitting: Urology

## 2024-07-27 DIAGNOSIS — R9721 Rising PSA following treatment for malignant neoplasm of prostate: Secondary | ICD-10-CM

## 2024-07-27 MED ORDER — ATORVASTATIN CALCIUM 40 MG PO TABS
40.0000 mg | ORAL_TABLET | Freq: Every day | ORAL | 1 refills | Status: AC
  Filled 2024-07-27: qty 90, 90d supply, fill #0

## 2024-07-27 MED ORDER — VALSARTAN-HYDROCHLOROTHIAZIDE 160-12.5 MG PO TABS
1.0000 | ORAL_TABLET | Freq: Every day | ORAL | 1 refills | Status: AC
  Filled 2024-07-27: qty 90, 90d supply, fill #0

## 2024-08-12 ENCOUNTER — Encounter (HOSPITAL_COMMUNITY)
Admission: RE | Admit: 2024-08-12 | Discharge: 2024-08-12 | Disposition: A | Discharge status: Home / Self Care | Source: Ambulatory Visit | Attending: Urology | Admitting: Urology

## 2024-08-12 DIAGNOSIS — C772 Secondary and unspecified malignant neoplasm of intra-abdominal lymph nodes: Secondary | ICD-10-CM | POA: Diagnosis not present

## 2024-08-12 DIAGNOSIS — R9721 Rising PSA following treatment for malignant neoplasm of prostate: Secondary | ICD-10-CM | POA: Insufficient documentation

## 2024-08-12 DIAGNOSIS — C61 Malignant neoplasm of prostate: Secondary | ICD-10-CM | POA: Diagnosis not present

## 2024-08-12 MED ORDER — FLOTUFOLASTAT F 18 GALLIUM 296-5846 MBQ/ML IV SOLN
8.3600 | Freq: Once | INTRAVENOUS | Status: AC
  Administered 2024-08-12: 8.36 via INTRAVENOUS

## 2024-09-16 ENCOUNTER — Other Ambulatory Visit: Payer: Self-pay

## 2024-09-17 ENCOUNTER — Other Ambulatory Visit (HOSPITAL_COMMUNITY): Payer: Self-pay
